# Patient Record
Sex: Female | Born: 1975 | Race: Black or African American | Hispanic: No | Marital: Single | Smoking: Never smoker
Health system: Southern US, Community
[De-identification: ages and names within clinical notes are randomized; demographics above are authoritative.]

## PROBLEM LIST (undated history)

## (undated) DIAGNOSIS — R87629 Unspecified abnormal cytological findings in specimens from vagina: Secondary | ICD-10-CM

## (undated) DIAGNOSIS — I10 Essential (primary) hypertension: Secondary | ICD-10-CM

## (undated) DIAGNOSIS — B009 Herpesviral infection, unspecified: Secondary | ICD-10-CM

## (undated) DIAGNOSIS — Q336 Congenital hypoplasia and dysplasia of lung: Secondary | ICD-10-CM

## (undated) DIAGNOSIS — O139 Gestational [pregnancy-induced] hypertension without significant proteinuria, unspecified trimester: Secondary | ICD-10-CM

## (undated) DIAGNOSIS — R87619 Unspecified abnormal cytological findings in specimens from cervix uteri: Secondary | ICD-10-CM

## (undated) DIAGNOSIS — IMO0002 Reserved for concepts with insufficient information to code with codable children: Secondary | ICD-10-CM

## (undated) HISTORY — DX: Unspecified abnormal cytological findings in specimens from vagina: R87.629

## (undated) HISTORY — DX: Reserved for concepts with insufficient information to code with codable children: IMO0002

## (undated) HISTORY — DX: Congenital hypoplasia and dysplasia of lung: Q33.6

## (undated) HISTORY — PX: CRYOTHERAPY: SHX1416

## (undated) HISTORY — DX: Unspecified abnormal cytological findings in specimens from cervix uteri: R87.619

---
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
## 1997-12-05 ENCOUNTER — Emergency Department (HOSPITAL_COMMUNITY): Admission: EM | Admit: 1997-12-05 | Discharge: 1997-12-05 | Payer: Self-pay | Admitting: Emergency Medicine

## 1999-07-08 ENCOUNTER — Encounter: Payer: Self-pay | Admitting: Family Medicine

## 1999-07-08 ENCOUNTER — Ambulatory Visit (HOSPITAL_COMMUNITY): Admission: RE | Admit: 1999-07-08 | Discharge: 1999-07-08 | Payer: Self-pay | Admitting: Family Medicine

## 2001-04-10 ENCOUNTER — Emergency Department (HOSPITAL_COMMUNITY): Admission: EM | Admit: 2001-04-10 | Discharge: 2001-04-11 | Payer: Self-pay | Admitting: Emergency Medicine

## 2002-03-01 ENCOUNTER — Other Ambulatory Visit: Admission: RE | Admit: 2002-03-01 | Discharge: 2002-03-01 | Payer: Self-pay | Admitting: Family Medicine

## 2011-10-21 DIAGNOSIS — R87613 High grade squamous intraepithelial lesion on cytologic smear of cervix (HGSIL): Secondary | ICD-10-CM | POA: Insufficient documentation

## 2011-11-26 ENCOUNTER — Telehealth: Payer: Self-pay | Admitting: Obstetrics and Gynecology

## 2011-11-26 NOTE — Telephone Encounter (Signed)
Tc to pt per telephone call. Pt has an appt 11/30/11 for abn pap smear. Pt states,"told will likely have a colposcopy due to abn pap". Informed pt if colpo is done, she should be able to return to work the same day. Informed pt to take otc Tylenol or Ibuprofen prior to appt in case colposcopy is performed. Pt voices understanding.

## 2011-11-26 NOTE — Telephone Encounter (Signed)
Lm on vm to cb per telephone.

## 2011-11-30 ENCOUNTER — Ambulatory Visit (INDEPENDENT_AMBULATORY_CARE_PROVIDER_SITE_OTHER): Payer: Private Health Insurance - Indemnity | Admitting: Obstetrics and Gynecology

## 2011-11-30 ENCOUNTER — Encounter: Payer: Self-pay | Admitting: Obstetrics and Gynecology

## 2011-11-30 VITALS — BP 122/70 | Ht 63.0 in | Wt 198.0 lb

## 2011-11-30 DIAGNOSIS — N898 Other specified noninflammatory disorders of vagina: Secondary | ICD-10-CM

## 2011-11-30 DIAGNOSIS — IMO0002 Reserved for concepts with insufficient information to code with codable children: Secondary | ICD-10-CM

## 2011-11-30 DIAGNOSIS — Z139 Encounter for screening, unspecified: Secondary | ICD-10-CM

## 2011-11-30 DIAGNOSIS — R6889 Other general symptoms and signs: Secondary | ICD-10-CM

## 2011-11-30 LAB — POCT WET PREP (WET MOUNT)
Clue Cells Wet Prep Whiff POC: NEGATIVE
pH: 5

## 2011-11-30 LAB — POCT URINE PREGNANCY: Preg Test, Ur: NEGATIVE

## 2011-11-30 NOTE — Progress Notes (Signed)
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

## 2011-11-30 NOTE — Addendum Note (Signed)
Addended by: Marla Roe A on: 11/30/2011 11:52 AM   Modules accepted: Orders

## 2011-12-02 ENCOUNTER — Telehealth: Payer: Self-pay | Admitting: Obstetrics and Gynecology

## 2011-12-02 LAB — PATHOLOGY

## 2011-12-02 NOTE — Telephone Encounter (Signed)
Tc to pt regarding msg, lm on vm to call back. 

## 2011-12-10 NOTE — Telephone Encounter (Signed)
Tc to pt regarding msg.  Lm on vm if question answered to disregard msg, if still has questions to call back.

## 2011-12-17 ENCOUNTER — Encounter: Payer: Self-pay | Admitting: Obstetrics and Gynecology

## 2011-12-17 ENCOUNTER — Ambulatory Visit (INDEPENDENT_AMBULATORY_CARE_PROVIDER_SITE_OTHER): Payer: Private Health Insurance - Indemnity | Admitting: Obstetrics and Gynecology

## 2011-12-17 VITALS — BP 122/74 | Resp 16 | Ht 63.0 in | Wt 199.0 lb

## 2011-12-17 DIAGNOSIS — IMO0002 Reserved for concepts with insufficient information to code with codable children: Secondary | ICD-10-CM

## 2011-12-17 DIAGNOSIS — R6889 Other general symptoms and signs: Secondary | ICD-10-CM

## 2011-12-17 NOTE — Progress Notes (Signed)
Here to f/u Bx and ECC results  A. Cervix- Biopsy, 2 o'clock:  High grade squamous intraepithelial lesion (HSIL), moderate dysplasia and HPV infection, CIN II.  See comment.  B. Cervix- Biopsy, 6 o'clock:  Low grade squamous intraepithelial lesion (LSIL), mild dysplasia and HPV infection, CIN I.  See comment.  C. Endocervix - Curettage:  Fragments of benign endocervical mucosa.  Negative for atypia.  Filed Vitals:   12/17/11 1513  BP: 122/74  Resp: 16   A/P Options and recs discussed Several questions answered Pt wants to do cryo - will schedule next available

## 2012-01-12 ENCOUNTER — Telehealth: Payer: Self-pay | Admitting: Obstetrics and Gynecology

## 2012-01-12 ENCOUNTER — Telehealth: Payer: Self-pay

## 2012-01-12 NOTE — Telephone Encounter (Signed)
JACKIE, THIS PT IS READY TO DO THE CRYO PROCEDURE WITH AR. I TOLD THE PT AS SOON AS WE CAN GET THE PROCEDURE TIME, WE WILL GIVE PT A CALL BACK. PLEASE MAKE SURE THIS PT IS TAKING CARE OF. THANKS

## 2012-01-12 NOTE — Telephone Encounter (Signed)
PT WANT TO PROCEED WITH THE CRYO SURGERY AND WANT TO MAKE SURE SOMEONE IS SCHEDULING PT. INFORMED PT THAT  ONCE  THEY GET A DATE WHEN AR  CAN DO THE PROCEDURE, SOMEONE WILL GIVE YOU A CALL BACK. PT VOICED UNDERSTANDING.

## 2012-01-12 NOTE — Telephone Encounter (Signed)
Lm for to call back

## 2012-01-20 ENCOUNTER — Telehealth: Payer: Self-pay

## 2012-01-20 NOTE — Telephone Encounter (Signed)
LM for pt to cb re scheduling cryo. Melody Comas A

## 2012-01-20 NOTE — Telephone Encounter (Signed)
Spoke to pt and sched appt. For 02/22/2012. Brenda Arellano A

## 2012-01-20 NOTE — Telephone Encounter (Signed)
LM for pt to cb to get scheduled for procedure. Melody Comas A

## 2012-02-22 ENCOUNTER — Encounter: Payer: Self-pay | Admitting: Obstetrics and Gynecology

## 2012-02-22 ENCOUNTER — Ambulatory Visit: Payer: Private Health Insurance - Indemnity | Admitting: Obstetrics and Gynecology

## 2012-02-22 VITALS — BP 112/62 | Ht 63.0 in | Wt 203.0 lb

## 2012-02-22 DIAGNOSIS — Z139 Encounter for screening, unspecified: Secondary | ICD-10-CM

## 2012-02-22 LAB — POCT URINE PREGNANCY: Preg Test, Ur: NEGATIVE

## 2012-02-22 NOTE — Progress Notes (Signed)
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

## 2012-02-24 ENCOUNTER — Emergency Department (HOSPITAL_BASED_OUTPATIENT_CLINIC_OR_DEPARTMENT_OTHER)
Admission: EM | Admit: 2012-02-24 | Discharge: 2012-02-24 | Disposition: A | Discharge status: Home / Self Care | Payer: Managed Care, Other (non HMO) | Attending: Emergency Medicine | Admitting: Emergency Medicine

## 2012-02-24 ENCOUNTER — Emergency Department (HOSPITAL_BASED_OUTPATIENT_CLINIC_OR_DEPARTMENT_OTHER): Payer: Managed Care, Other (non HMO)

## 2012-02-24 ENCOUNTER — Encounter (HOSPITAL_BASED_OUTPATIENT_CLINIC_OR_DEPARTMENT_OTHER): Payer: Self-pay | Admitting: *Deleted

## 2012-02-24 DIAGNOSIS — R079 Chest pain, unspecified: Secondary | ICD-10-CM

## 2012-02-24 DIAGNOSIS — R05 Cough: Secondary | ICD-10-CM | POA: Insufficient documentation

## 2012-02-24 DIAGNOSIS — Z79899 Other long term (current) drug therapy: Secondary | ICD-10-CM | POA: Insufficient documentation

## 2012-02-24 DIAGNOSIS — R0789 Other chest pain: Secondary | ICD-10-CM | POA: Insufficient documentation

## 2012-02-24 DIAGNOSIS — R059 Cough, unspecified: Secondary | ICD-10-CM | POA: Insufficient documentation

## 2012-02-24 DIAGNOSIS — Z9889 Other specified postprocedural states: Secondary | ICD-10-CM | POA: Insufficient documentation

## 2012-02-24 LAB — CBC WITH DIFFERENTIAL/PLATELET
Basophils Absolute: 0 10*3/uL (ref 0.0–0.1)
Basophils Relative: 1 % (ref 0–1)
Eosinophils Absolute: 0.1 10*3/uL (ref 0.0–0.7)
Eosinophils Relative: 2 % (ref 0–5)
HCT: 35.8 % — ABNORMAL LOW (ref 36.0–46.0)
MCHC: 34.1 g/dL (ref 30.0–36.0)
MCV: 86.7 fL (ref 78.0–100.0)
Monocytes Absolute: 0.8 10*3/uL (ref 0.1–1.0)
Platelets: 209 10*3/uL (ref 150–400)
RDW: 12.9 % (ref 11.5–15.5)

## 2012-02-24 LAB — D-DIMER, QUANTITATIVE: D-Dimer, Quant: <0.27 ug/mL-FEU (ref 0.00–0.48)

## 2012-02-24 LAB — BASIC METABOLIC PANEL
Calcium: 9.7 mg/dL (ref 8.4–10.5)
Creatinine, Ser: 0.9 mg/dL (ref 0.50–1.10)
GFR calc Af Amer: >90 mL/min (ref >90)
GFR calc non Af Amer: 81 mL/min — ABNORMAL LOW (ref >90)

## 2012-02-24 NOTE — ED Notes (Signed)
Coughing throughout the day. States this evening she had a sudden onset of sharp pain in the center of her chest.

## 2012-02-24 NOTE — ED Notes (Signed)
Pt.  Reports she had lung surgery on the R lung for the lung being up in the shoulder blade area.  Pt. In no distress and has no cough upon assessment.  Pt. Has clear lung snds bilat.

## 2012-02-24 NOTE — ED Provider Notes (Signed)
History     CSN: [id number]  Arrival date & time 02/24/12  1748   First MD Initiated Contact with Patient 02/24/12 1817      Chief Complaint  Patient presents with  . Chest Pain    (Consider location/radiation/quality/duration/timing/severity/associated sxs/prior treatment) HPI Pt reports several episodes of moderate sharp mostly right sided chest pain throughout the day today. She has had some coughing, but no fever, no sputum, no SOB. Pain comes suddenly and lasts about 3 min. No particular provoking or relieving factors. Denies prior history of same. She is low risk [place], but PERC positive for birth control.   History reviewed. No pertinent past medical history.  Past Surgical History  Procedure Date  . Right lung 1987    Family History  Problem Relation Age of Onset  . Kidney disease Father     History  Substance Use Topics  . Smoking status: Never Smoker   . Smokeless tobacco: Not on file  . Alcohol Use: Yes    OB History    Grav Para Term Preterm Abortions TAB SAB Ect Mult Living   0               Review of Systems All other systems reviewed and are negative except as noted in HPI.   Allergies  Review of patient's allergies indicates no known allergies.  Home Medications   Current Outpatient Rx  Name  Route  Sig  Dispense  Refill  . DROSPIRENONE-ETHINYL ESTRADIOL 3-0.02 MG PO TABS   Oral   Take 1 tablet by mouth daily.           BP 136/81  Pulse 92  Temp 98.4 F (36.9 C) (Oral)  Resp 22  SpO2 100%  LMP 02/13/2012  Physical Exam  Nursing note and vitals reviewed. Constitutional: She is oriented to person, place, and time. She appears well-developed and well-nourished.  HENT:  Head: Normocephalic and atraumatic.  Eyes: EOM are normal. Pupils are equal, round, and reactive to light.  Neck: Normal range of motion. Neck supple.  Cardiovascular: Normal rate, normal heart sounds and intact distal pulses.   Pulmonary/Chest: Effort normal and  breath sounds normal. She exhibits no tenderness.  Abdominal: Bowel sounds are normal. She exhibits no distension. There is no tenderness.  Musculoskeletal: Normal range of motion. She exhibits no edema and no tenderness.  Neurological: She is alert and oriented to person, place, and time. She has normal strength. No cranial nerve deficit or sensory deficit.  Skin: Skin is warm and dry. No rash noted.  Psychiatric: She has a normal mood and affect.    ED Course  Procedures (including critical care time)  Labs Reviewed  CBC WITH DIFFERENTIAL - Abnormal; Notable for the following:    HCT 35.8 (*)     Monocytes Relative 13 (*)     All other components within normal limits  BASIC METABOLIC PANEL - Abnormal; Notable for the following:    Glucose, Bld 125 (*)     GFR calc non Af Amer 81 (*)     All other components within normal limits  D-DIMER, QUANTITATIVE   Dg Chest 2 View  02/24/2012  *RADIOLOGY REPORT*  Clinical Data:   chest pain. ; congenital hypoplastic right lung  CHEST - 2 VIEW  Comparison: 07/08/1999 by report only  Findings: Marked elevation of the right diaphragmatic leaflet, as was described on previous exam.  Lungs clear.  No definite effusion.  Heart size normal.  Regional bones unremarkable.  IMPRESSION:  1.  Elevated right hemidiaphragm, probably chronic.  No definite acute disease.   Original Report Authenticated By: D. Andria Rhein, MD      No diagnosis found.    MDM   Date: 02/24/2012  Rate: 89  Rhythm: normal sinus rhythm  QRS Axis: normal  Intervals: normal  ST/T Wave abnormalities: normal  Conduction Disutrbances: none  Narrative Interpretation: unremarkable  8:10 PM Labs unremarkable. Symptoms atypical for ACS/CAD. Low risk for acute life threatening cardiac or pulmonary process.           Sharanya Templin B. Bernette Mayers, MD 02/24/12 2011

## 2012-04-07 ENCOUNTER — Encounter: Payer: Self-pay | Admitting: Obstetrics and Gynecology

## 2012-04-07 ENCOUNTER — Ambulatory Visit: Payer: Private Health Insurance - Indemnity | Admitting: Obstetrics and Gynecology

## 2012-04-07 VITALS — BP 112/72 | HR 72 | Wt 207.0 lb

## 2012-04-07 DIAGNOSIS — N898 Other specified noninflammatory disorders of vagina: Secondary | ICD-10-CM

## 2012-04-07 DIAGNOSIS — N760 Acute vaginitis: Secondary | ICD-10-CM

## 2012-04-07 LAB — POCT WET PREP (WET MOUNT)

## 2012-04-07 MED ORDER — TINIDAZOLE 500 MG PO TABS: 2.0000 g | ORAL_TABLET | Freq: Every day | ORAL | Status: DC

## 2012-04-07 NOTE — Addendum Note (Signed)
Addended by: Robin Searing on: 04/07/2012 04:32 PM   Modules accepted: Orders

## 2012-04-07 NOTE — Progress Notes (Addendum)
Here to f/u cryo  Filed Vitals:   04/07/12 1454  BP: 112/72  Pulse: 72   ROS: noncontributory  Pelvic exam:  VULVA: normal appearing vulva with no masses, tenderness or lesions,  VAGINA: normal appearing vagina with normal color and discharge, no lesions, CERVIX: normal appearing cervix without discharge or lesions, well healed, white d/c UTERUS: uterus is normal size, shape, consistency and nontender,  ADNEXA: normal adnexa in size, nontender and no masses.  A/P rec repeat pap q4-45mths x 33yr Wet prep - borderline BV - tindamax

## 2012-04-07 NOTE — Addendum Note (Signed)
Addended by: Osborn Coho on: 04/07/2012 04:33 PM   Modules accepted: Orders

## 2015-06-05 ENCOUNTER — Emergency Department (HOSPITAL_BASED_OUTPATIENT_CLINIC_OR_DEPARTMENT_OTHER): Payer: Managed Care, Other (non HMO)

## 2015-06-05 ENCOUNTER — Encounter (HOSPITAL_BASED_OUTPATIENT_CLINIC_OR_DEPARTMENT_OTHER): Payer: Self-pay | Admitting: *Deleted

## 2015-06-05 ENCOUNTER — Emergency Department (HOSPITAL_BASED_OUTPATIENT_CLINIC_OR_DEPARTMENT_OTHER)
Admission: EM | Admit: 2015-06-05 | Discharge: 2015-06-05 | Disposition: A | Discharge status: Home / Self Care | Payer: Managed Care, Other (non HMO) | Attending: Emergency Medicine | Admitting: Emergency Medicine

## 2015-06-05 DIAGNOSIS — R079 Chest pain, unspecified: Secondary | ICD-10-CM | POA: Insufficient documentation

## 2015-06-05 LAB — COMPREHENSIVE METABOLIC PANEL
ALT: 15 U/L (ref 14–54)
AST: 18 U/L (ref 15–41)
Albumin: 3.9 g/dL (ref 3.5–5.0)
Alkaline Phosphatase: 58 U/L (ref 38–126)
Anion gap: 2 — ABNORMAL LOW (ref 5–15)
BILIRUBIN TOTAL: 1.1 mg/dL (ref 0.3–1.2)
BUN: 8 mg/dL (ref 6–20)
CHLORIDE: 107 mmol/L (ref 101–111)
CO2: 27 mmol/L (ref 22–32)
CREATININE: 0.9 mg/dL (ref 0.44–1.00)
Calcium: 9 mg/dL (ref 8.9–10.3)
Glucose, Bld: 94 mg/dL (ref 65–99)
POTASSIUM: 4 mmol/L (ref 3.5–5.1)
Sodium: 136 mmol/L (ref 135–145)
TOTAL PROTEIN: 7.7 g/dL (ref 6.5–8.1)

## 2015-06-05 LAB — CBC WITH DIFFERENTIAL/PLATELET
BASOS PCT: 1 %
Basophils Absolute: 0 10*3/uL (ref 0.0–0.1)
Eosinophils Absolute: 0.1 10*3/uL (ref 0.0–0.7)
Eosinophils Relative: 2 %
HEMATOCRIT: 38.7 % (ref 36.0–46.0)
HEMOGLOBIN: 12.9 g/dL (ref 12.0–15.0)
LYMPHS ABS: 1.4 10*3/uL (ref 0.7–4.0)
Lymphocytes Relative: 23 %
MCH: 29.7 pg (ref 26.0–34.0)
MCHC: 33.3 g/dL (ref 30.0–36.0)
MCV: 89 fL (ref 78.0–100.0)
MONOS PCT: 14 %
Monocytes Absolute: 0.9 10*3/uL (ref 0.1–1.0)
NEUTROS PCT: 61 %
Neutro Abs: 3.7 10*3/uL (ref 1.7–7.7)
Platelets: 206 10*3/uL (ref 150–400)
RBC: 4.35 MIL/uL (ref 3.87–5.11)
RDW: 13.3 % (ref 11.5–15.5)
WBC: 6.1 10*3/uL (ref 4.0–10.5)

## 2015-06-05 LAB — TROPONIN I: Troponin I: <0.03 ng/mL (ref <0.031)

## 2015-06-05 LAB — LIPASE, BLOOD: LIPASE: 18 U/L (ref 11–51)

## 2015-06-05 NOTE — ED Notes (Signed)
Pt c/o left sided  Chest pain  X 1 hr with left arm "tingling" denies n/v or SOB

## 2015-06-05 NOTE — ED Notes (Signed)
Presents today with chest pain, onset prior to lunch. Chest resolved after lunch, upon returning to work, chest pains returns. Pain is at left ant chest, also began having pain under left breast, burning sensation, lasting approx 30 min.

## 2015-06-05 NOTE — ED Provider Notes (Signed)
CSN: [id number]     Arrival date & time 06/05/15  1439 History   First MD Initiated Contact with Patient 06/05/15 1512     Chief Complaint  Patient presents with  . Chest Pain     Patient is a 40 y.o. female presenting with chest pain. The history is provided by the patient. No language interpreter was used.  Chest Pain  MARCENE LASKOWSKI is a 40 y.o. female who presents to the Emergency Department complaining of chest pain. She reports intermittent left upper chest pain and left axillary chest pain that started about 1:00 today. The pain is waxing and waning and a burning type sensation. Episodes last about 5 minutes. There are no clear alleviating or worsening factors. She had a little bit of tingling in heat in her left hand. This is also resolving. No nausea, vomiting, abdominal pain, diaphoresis, leg swelling or pain. She has no medical problems. 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 No family history of early cardiac disease.  Past Medical History  Diagnosis Date  . Abnormal Pap smear    Past Surgical History  Procedure Laterality Date  . Right lung  1987   Family History  Problem Relation Age of Onset  . Kidney disease Father    Social History  Substance Use Topics  . Smoking status: Never Smoker   . Smokeless tobacco: Never Used  . Alcohol Use: Yes   OB History    Gravida Para Term Preterm AB TAB SAB Ectopic Multiple Living   0              Review of Systems  Cardiovascular: Positive for chest pain.      Allergies  Review of patient's allergies indicates no known allergies.  Home Medications   Prior to Admission medications   Not on File   BP 117/70 mmHg  Pulse 68  Temp(Src) 99.1 F (37.3 C) (Oral)  Resp 16  Ht  (1.6 m)  Wt 240 lb (108.863 kg)  BMI 42.52 kg/m2  SpO2 99%  LMP 05/14/2015 Physical Exam  Constitutional: She is oriented to person, place, and time. She appears well-developed and  well-nourished.  HENT:  Head: Normocephalic and atraumatic.  Cardiovascular: Normal rate and regular rhythm.   No murmur heard. Pulmonary/Chest: Effort normal. No respiratory distress. She exhibits no tenderness.  Decreased air movement in right lung fields.   Abdominal: Soft. There is no rebound and no guarding.  Mild LUQ tenderness  Musculoskeletal: She exhibits no edema or tenderness.  Neurological: She is alert and oriented to person, place, and time.  Skin: Skin is warm and dry.  Psychiatric: She has a normal mood and affect. Her behavior is normal.  Nursing note and vitals reviewed.   ED Course  Procedures (including critical care time) Labs Review Labs Reviewed  COMPREHENSIVE METABOLIC PANEL - Abnormal; Notable for the following:    Anion gap 2 (*)    All other components within normal limits  CBC WITH DIFFERENTIAL/PLATELET  TROPONIN I  LIPASE, BLOOD  TROPONIN I    Imaging Review Dg Chest 2 View  06/05/2015  CLINICAL DATA:  Chest pain EXAM: CHEST  2 VIEW COMPARISON:  02/24/2012 FINDINGS: Chronic elevation of the right hemidiaphragm is noted. Normal heart size. No pleural effusion or edema. No airspace consolidation is identified. The visualized osseous structures are unremarkable. IMPRESSION: 1. 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 2. No acute findings.  Electronically Signed   By: Signa Kellaylor  Stroud M.D.   On: 06/05/2015 17:38   I have personally reviewed and evaluated these images and lab results as part of my medical decision-making.   EKG Interpretation   Date/Time:  Thursday June 05 2015 14:45:28 EDT Ventricular Rate:  100 PR Interval:  144 QRS Duration: 96 QT Interval:  342 QTC Calculation: 441 R Axis:   15 Text Interpretation:  Normal sinus rhythm Possible Left atrial enlargement  Incomplete right bundle branch block Borderline ECG Incomplete right  bundle branch block is new Confirmed by [facility] RecoveryCHLOSSMAN MD, ERIN ([id number]) on  06/05/2015 2:58:45  PM      MDM   Final diagnoses:  Chest pain, unspecified chest pain type    Patient here for evaluation of chest pain, now resolved in the department. Presentation is not consistent with PE, ACS, dissection, pneumonia. Discussed home care, outpatient follow-up, return precautions.    Tilden FossaElizabeth Lawana Hartzell, MD 06/05/15 2340

## 2015-06-05 NOTE — Discharge Instructions (Signed)
Nonspecific Chest Pain  °Chest pain can be caused by many different conditions. There is always a chance that your pain could be related to something serious, such as a heart attack or a blood clot in your lungs. Chest pain can also be caused by conditions that are not life-threatening. If you have chest pain, it is very important to follow up with your health care provider. °CAUSES  °Chest pain can be caused by: °· Heartburn. °· Pneumonia or bronchitis. °· Anxiety or stress. °· Inflammation around your heart (pericarditis) or lung (pleuritis or pleurisy). °· A blood clot in your lung. °· A collapsed lung (pneumothorax). It can develop suddenly on its own (spontaneous pneumothorax) or from trauma to the chest. °· Shingles infection (varicella-zoster virus). °· Heart attack. °· Damage to the bones, muscles, and cartilage that make up your chest wall. This can include: °¨ Bruised bones due to injury. °¨ Strained muscles or cartilage due to frequent or repeated coughing or overwork. °¨ Fracture to one or more ribs. °¨ Sore cartilage due to inflammation (costochondritis). °RISK FACTORS  °Risk factors for chest pain may include: °· Activities that increase your risk for trauma or injury to your chest. °· Respiratory infections or conditions that cause frequent coughing. °· Medical conditions or overeating that can cause heartburn. °· Heart disease or family history of heart disease. °· Conditions or health behaviors that increase your risk of developing a blood clot. °· Having had chicken pox (varicella zoster). °SIGNS AND SYMPTOMS °Chest pain can feel like: °· Burning or tingling on the surface of your chest or deep in your chest. °· Crushing, pressure, aching, or squeezing pain. °· Dull or sharp pain that is worse when you move, cough, or take a deep breath. °· Pain that is also felt in your back, neck, shoulder, or arm, or pain that spreads to any of these areas. °Your chest pain may come and go, or it may stay  constant. °DIAGNOSIS °Lab tests or other studies may be needed to find the cause of your pain. Your health care provider may have you take a test called an ambulatory ECG (electrocardiogram). An ECG records your heartbeat patterns at the time the test is performed. You may also have other tests, such as: °· Transthoracic echocardiogram (TTE). During echocardiography, sound waves are used to create a picture of all of the heart structures and to look at how blood flows through your heart. °· Transesophageal echocardiogram (TEE). This is a more advanced imaging test that obtains images from inside your body. It allows your health care provider to see your heart in finer detail. °· Cardiac monitoring. This allows your health care provider to monitor your heart rate and rhythm in real time. °· Holter monitor. This is a portable device that records your heartbeat and can help to diagnose abnormal heartbeats. It allows your health care provider to track your heart activity for several days, if needed. °· Stress tests. These can be done through exercise or by taking medicine that makes your heart beat more quickly. °· Blood tests. °· Imaging tests. °TREATMENT  °Your treatment depends on what is causing your chest pain. Treatment may include: °· Medicines. These may include: °¨ Acid blockers for heartburn. °¨ Anti-inflammatory medicine. °¨ Pain medicine for inflammatory conditions. °¨ Antibiotic medicine, if an infection is present. °¨ Medicines to dissolve blood clots. °¨ Medicines to treat coronary artery disease. °· Supportive care for conditions that do not require medicines. This may include: °¨ Resting. °¨ Applying heat   or cold packs to injured areas. °¨ Limiting activities until pain decreases. °HOME CARE INSTRUCTIONS °· If you were prescribed an antibiotic medicine, finish it all even if you start to feel better. °· Avoid any activities that bring on chest pain. °· Do not use any tobacco products, including  cigarettes, chewing tobacco, or electronic cigarettes. If you need help quitting, ask your health care provider. °· Do not drink alcohol. °· Take medicines only as directed by your health care provider. °· Keep all follow-up visits as directed by your health care provider. This is important. This includes any further testing if your chest pain does not go away. °· If heartburn is the cause for your chest pain, you may be told to keep your head raised (elevated) while sleeping. This reduces the chance that acid will go from your stomach into your esophagus. °· Make lifestyle changes as directed by your health care provider. These may include: °¨ Getting regular exercise. Ask your health care provider to suggest some activities that are safe for you. °¨ Eating a heart-healthy diet. A registered dietitian can help you to learn healthy eating options. °¨ Maintaining a healthy weight. °¨ Managing diabetes, if necessary. °¨ Reducing stress. °SEEK MEDICAL CARE IF: °· Your chest pain does not go away after treatment. °· You have a rash with blisters on your chest. °· You have a fever. °SEEK IMMEDIATE MEDICAL CARE IF:  °· Your chest pain is worse. °· You have an increasing cough, or you cough up blood. °· You have severe abdominal pain. °· You have severe weakness. °· You faint. °· You have chills. °· You have sudden, unexplained chest discomfort. °· You have sudden, unexplained discomfort in your arms, back, neck, or jaw. °· You have shortness of breath at any time. °· You suddenly start to sweat, or your skin gets clammy. °· You feel nauseous or you vomit. °· You suddenly feel light-headed or dizzy. °· Your heart begins to beat quickly, or it feels like it is skipping beats. °These symptoms may represent a serious problem that is an emergency. Do not wait to see if the symptoms will go away. Get medical help right away. Call your local emergency services (911 in the [place] Do not drive yourself to the hospital. °  °This  information is not intended to replace advice given to you by your health care provider. Make sure you discuss any questions you have with your health care provider. °  °Document Released: 11/04/2004 Document Revised: 02/15/2014 Document Reviewed: 08/31/2013 °[employer] Interactive Patient Education ©2016 [employer] ° °

## 2015-12-17 ENCOUNTER — Ambulatory Visit (HOSPITAL_COMMUNITY)
Admission: EM | Admit: 2015-12-17 | Discharge: 2015-12-17 | Disposition: A | Discharge status: Home / Self Care | Payer: Managed Care, Other (non HMO) | Attending: Family Medicine | Admitting: Family Medicine

## 2015-12-17 ENCOUNTER — Encounter (HOSPITAL_COMMUNITY): Payer: Self-pay | Admitting: *Deleted

## 2015-12-17 DIAGNOSIS — J029 Acute pharyngitis, unspecified: Secondary | ICD-10-CM | POA: Diagnosis not present

## 2015-12-17 DIAGNOSIS — B9789 Other viral agents as the cause of diseases classified elsewhere: Secondary | ICD-10-CM

## 2015-12-17 DIAGNOSIS — J069 Acute upper respiratory infection, unspecified: Secondary | ICD-10-CM

## 2015-12-17 LAB — POCT RAPID STREP A: STREPTOCOCCUS, GROUP A SCREEN (DIRECT): NEGATIVE

## 2015-12-17 MED ORDER — IBUPROFEN 100 MG/5ML PO SUSP
600.0000 mg | Freq: Once | ORAL | Status: AC
  Administered 2015-12-17: 600 mg via ORAL

## 2015-12-17 MED ORDER — ONDANSETRON HCL 4 MG PO TABS: 4.0000 mg | ORAL_TABLET | Freq: Four times a day (QID) | ORAL | 0 refills | Status: DC

## 2015-12-17 MED ORDER — IBUPROFEN 100 MG/5ML PO SUSP
ORAL | Status: AC
  Filled 2015-12-17: qty 30

## 2015-12-17 NOTE — ED Provider Notes (Signed)
[facility]  CSN: [id number] Arrival date & time: 12/17/15  [id number]  History   Chief Complaint Chief Complaint  Patient presents with  . Cough   HPI Brenda Arellano is a 40 y.o. female nonsmoker presenting for sore throat and cough.   She reports gradual onset of illness over 2 days starting with sore throat, chills, and developing fatigue, malaise and subjective fevers. Congestion and a cough developed productive of clear sputum yesterday and multiple episodes of emesis and constant nausea today. She's drunk a bit of water here, otherwise nothing today. No blood in emesis, no diarrhea, abd pain, dysuria. No chest pain, dyspnea, wheezing, palpitations. Has not tried medications yet.   Past Medical History:  Diagnosis Date  . Abnormal Pap smear     Patient Active Problem List   Diagnosis Date Noted  . 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 Right hypoplastic lung  1987    OB History    Gravida Para Term Preterm AB Living   0             SAB TAB Ectopic Multiple Live Births                 Home Medications    Prior to Admission medications   Medication Sig Start Date End Date Taking? Authorizing Provider  ondansetron (ZOFRAN) 4 MG tablet Take 1 tablet (4 mg total) by mouth every 6 (six) hours. 12/17/15   Tyrone Nineyan B Grunz, MD    Family History Family History  Problem Relation Age of Onset  . Kidney disease Father     Social History Social History  Substance Use Topics  . Smoking status: Never Smoker  . Smokeless tobacco: Never Used  . Alcohol use Yes     Comment: occasionally     Allergies   Patient has no known allergies.   Review of Systems Review of Systems As above  Physical Exam Triage Vital Signs ED Triage Vitals  Enc Vitals Group     BP 12/17/15 1929 122/80     Pulse Rate 12/17/15 1929 115     Resp 12/17/15 1929 22   Temp 12/17/15 1929 99.2 F (37.3 C)     Temp Source 12/17/15 1929 Oral     SpO2 12/17/15 1929 95 %     Weight --      Height --      Head Circumference --      Peak Flow --      Pain Score 12/17/15 1933 8     Pain Loc --      Pain Edu? --      Excl. in GC? --    No data found.   Updated Vital Signs BP 122/80 (BP Location: Right Arm)   Pulse 115   Temp 100.8 F (38.2 C) (Temporal)   Resp 22   LMP 12/09/2015 (Exact Date)   SpO2 95%   Physical Exam  Constitutional: She is oriented to person, place, and time. She appears well-developed and well-nourished. No distress.  HENT:  Right Ear: External ear normal.  Left Ear: External ear normal.  Nose: Nose normal.  erythematous oropharynx with enlarged injected tonsils without exudates.   Eyes: Conjunctivae and EOM are normal. Pupils are equal, round, and reactive to light. No scleral icterus.  Neck: Normal range of motion. Neck supple. No JVD present.  Cardiovascular: Normal heart sounds and intact distal pulses.   No murmur heard. Tachycardic but regular  Pulmonary/Chest: Effort normal and breath sounds normal. No respiratory distress. She has no wheezes. She has no rales.  Abdominal: Soft. Bowel sounds are normal. She exhibits no distension. There is no tenderness.  Musculoskeletal: Normal range of motion. She exhibits no edema or tenderness.  Lymphadenopathy:    She has cervical adenopathy (tender anterior).  Neurological: She is alert and oriented to person, place, and time. She exhibits normal muscle tone.  Skin: Skin is warm and dry. Capillary refill takes less than 2 seconds.  Vitals reviewed.  UC Treatments / Results  Labs (all labs ordered are listed, but only abnormal results are displayed) Labs Reviewed  POCT RAPID STREP A    EKG  EKG Interpretation None      Radiology No results found.  Procedures Procedures (including critical care time)  Medications Ordered in UC Medications  ibuprofen  (ADVIL,MOTRIN) 100 MG/5ML suspension 600 mg (600 mg Oral Given 12/17/15 1935)    Initial Impression / Assessment and Plan / UC Course  I have reviewed the triage vital signs and the nursing notes.  Pertinent labs & imaging results that were available during my care of the patient were reviewed by me and considered in my medical decision making (see chart for details).  Final Clinical Impressions(s) / UC Diagnoses   Final diagnoses:  Viral URI with cough   40 y.o. female presenting with 48 hours gradually worsening URI symptoms without abrupt onset to suggest influenza and without pulmonary exam findings consistent with pneumonia. She has URI symptoms that started with sore throat and has severely injected tonsils and anterior cervical lymphadenopathy on exam, so rapid strep test is performed and negative, culture to follow, without empiric treatment. Zofran provided as part of supportive therapy including hydration, lozenges, honey, nasal saline. Return precautions reviewed.   New Prescriptions New Prescriptions   ONDANSETRON (ZOFRAN) 4 MG TABLET    Take 1 tablet (4 mg total) by mouth every 6 (six) hours.     Tyrone Nineyan B Grunz, MD 12/17/15 2012

## 2015-12-17 NOTE — Discharge Instructions (Signed)
You have a nasty virus.  The good news is it goes away on its own and the bad news is we don't have much of a treatment to make it go away faster. Antibiotics are useless against viruses. If your strep swab is positive, we will contact you as soon as it results and send in antibiotics for it.  What to do:  - Drink adequate fluids. Take zofran if needed to tolerate them. - Use nasal saline spray as needed to help clear runny nose. - Take warm showers and drink hot tea. The humid air will help with runny nose and congestion. - Take 1 tbsp of honey every 2 hours to help with cough and sore throat. - You can use tylenol alternating with motrin every 3 hours for fever with pain.  - All members in the household should wash their hands frequently. Cold-causing viruses can stay on hands for 2 days or more.  - Minimize your exposure to smoke from cigarettes, etc.  Timeline:  - Colds usually persist for 3 to 10 days in the most people but it can take 2-3 weeks for cough to completely go away.  - If you start getting worse after initial improvement or if this is not going away in the next 10 days you should make another appointment. If you have difficulty breathing you should present to the emergency room right away.

## 2015-12-17 NOTE — ED Triage Notes (Signed)
C/O HA, productive cough, extreme fatigue, nausea since yesterday.  Had 2 episodes vomiting yesterday; none today.  Able to keep down PO fluids, but poor appetite.  C/O feeling feverish.

## 2015-12-20 LAB — CULTURE, GROUP A STREP (THRC)

## 2016-02-26 DIAGNOSIS — Z3490 Encounter for supervision of normal pregnancy, unspecified, unspecified trimester: Secondary | ICD-10-CM | POA: Insufficient documentation

## 2016-02-26 DIAGNOSIS — J984 Other disorders of lung: Secondary | ICD-10-CM | POA: Insufficient documentation

## 2016-02-26 LAB — OB RESULTS CONSOLE HIV ANTIBODY (ROUTINE TESTING): HIV: NONREACTIVE

## 2016-02-26 LAB — OB RESULTS CONSOLE RUBELLA ANTIBODY, IGM: Rubella: IMMUNE

## 2016-02-26 LAB — OB RESULTS CONSOLE HEPATITIS B SURFACE ANTIGEN: HEP B S AG: NEGATIVE

## 2016-02-26 LAB — OB RESULTS CONSOLE RPR: RPR: NONREACTIVE

## 2016-02-26 LAB — OB RESULTS CONSOLE ANTIBODY SCREEN: ANTIBODY SCREEN: NEGATIVE

## 2016-02-26 LAB — OB RESULTS CONSOLE ABO/RH: RH Type: NEGATIVE

## 2016-02-26 LAB — OB RESULTS CONSOLE GC/CHLAMYDIA
CHLAMYDIA, DNA PROBE: NEGATIVE
GC PROBE AMP, GENITAL: NEGATIVE

## 2016-03-21 DIAGNOSIS — Z6791 Unspecified blood type, Rh negative: Secondary | ICD-10-CM | POA: Insufficient documentation

## 2016-03-21 DIAGNOSIS — O09513 Supervision of elderly primigravida, third trimester: Secondary | ICD-10-CM | POA: Insufficient documentation

## 2016-08-23 ENCOUNTER — Inpatient Hospital Stay (HOSPITAL_COMMUNITY)
Admission: AD | Admit: 2016-08-23 | Payer: Managed Care, Other (non HMO) | Source: Ambulatory Visit | Admitting: Obstetrics and Gynecology

## 2016-09-16 LAB — OB RESULTS CONSOLE GBS: STREP GROUP B AG: NEGATIVE

## 2016-10-07 ENCOUNTER — Encounter (HOSPITAL_COMMUNITY): Payer: Self-pay | Admitting: *Deleted

## 2016-10-07 ENCOUNTER — Telehealth (HOSPITAL_COMMUNITY): Payer: Self-pay | Admitting: *Deleted

## 2016-10-07 NOTE — Telephone Encounter (Signed)
Preadmission screen  

## 2016-10-08 ENCOUNTER — Telehealth (HOSPITAL_COMMUNITY): Payer: Self-pay | Admitting: *Deleted

## 2016-10-08 ENCOUNTER — Encounter (HOSPITAL_COMMUNITY): Payer: Self-pay | Admitting: *Deleted

## 2016-10-08 NOTE — Telephone Encounter (Signed)
Preadmission screen  

## 2016-10-15 ENCOUNTER — Other Ambulatory Visit: Payer: Self-pay | Admitting: Obstetrics and Gynecology

## 2016-10-17 ENCOUNTER — Inpatient Hospital Stay (HOSPITAL_COMMUNITY): Payer: Medicaid Other

## 2016-10-17 ENCOUNTER — Encounter (HOSPITAL_COMMUNITY): Payer: Self-pay

## 2016-10-17 ENCOUNTER — Inpatient Hospital Stay (HOSPITAL_COMMUNITY)
Admission: RE | Admit: 2016-10-17 | Discharge: 2016-10-22 | DRG: 765 | Disposition: A | Discharge status: Home / Self Care | Payer: Medicaid Other | Source: Ambulatory Visit | Attending: Obstetrics and Gynecology | Admitting: Obstetrics and Gynecology

## 2016-10-17 VITALS — BP 125/80 | HR 80 | Temp 98.9°F | Resp 18 | Ht 63.0 in | Wt 217.0 lb

## 2016-10-17 DIAGNOSIS — O99214 Obesity complicating childbirth: Secondary | ICD-10-CM | POA: Diagnosis present

## 2016-10-17 DIAGNOSIS — O4103X0 Oligohydramnios, third trimester, not applicable or unspecified: Secondary | ICD-10-CM | POA: Diagnosis present

## 2016-10-17 DIAGNOSIS — A6000 Herpesviral infection of urogenital system, unspecified: Secondary | ICD-10-CM | POA: Diagnosis present

## 2016-10-17 DIAGNOSIS — Z6791 Unspecified blood type, Rh negative: Secondary | ICD-10-CM | POA: Diagnosis not present

## 2016-10-17 DIAGNOSIS — O9902 Anemia complicating childbirth: Secondary | ICD-10-CM | POA: Diagnosis present

## 2016-10-17 DIAGNOSIS — O26893 Other specified pregnancy related conditions, third trimester: Secondary | ICD-10-CM | POA: Diagnosis present

## 2016-10-17 DIAGNOSIS — D649 Anemia, unspecified: Secondary | ICD-10-CM | POA: Diagnosis present

## 2016-10-17 DIAGNOSIS — Z6838 Body mass index (BMI) 38.0-38.9, adult: Secondary | ICD-10-CM | POA: Diagnosis not present

## 2016-10-17 DIAGNOSIS — O9832 Other infections with a predominantly sexual mode of transmission complicating childbirth: Secondary | ICD-10-CM | POA: Diagnosis present

## 2016-10-17 DIAGNOSIS — Z98891 History of uterine scar from previous surgery: Secondary | ICD-10-CM

## 2016-10-17 DIAGNOSIS — O365930 Maternal care for other known or suspected poor fetal growth, third trimester, not applicable or unspecified: Secondary | ICD-10-CM | POA: Diagnosis present

## 2016-10-17 DIAGNOSIS — Z3A40 40 weeks gestation of pregnancy: Secondary | ICD-10-CM

## 2016-10-17 DIAGNOSIS — E669 Obesity, unspecified: Secondary | ICD-10-CM | POA: Diagnosis present

## 2016-10-17 DIAGNOSIS — O1414 Severe pre-eclampsia complicating childbirth: Secondary | ICD-10-CM | POA: Diagnosis present

## 2016-10-17 DIAGNOSIS — O320XX0 Maternal care for unstable lie, not applicable or unspecified: Secondary | ICD-10-CM

## 2016-10-17 DIAGNOSIS — O864 Pyrexia of unknown origin following delivery: Secondary | ICD-10-CM | POA: Diagnosis not present

## 2016-10-17 DIAGNOSIS — O1490 Unspecified pre-eclampsia, unspecified trimester: Secondary | ICD-10-CM | POA: Diagnosis present

## 2016-10-17 HISTORY — DX: Herpesviral infection, unspecified: B00.9

## 2016-10-17 LAB — CBC
HCT: 34.2 % — ABNORMAL LOW (ref 36.0–46.0)
Hemoglobin: 11.7 g/dL — ABNORMAL LOW (ref 12.0–15.0)
MCH: 29.1 pg (ref 26.0–34.0)
MCHC: 34.2 g/dL (ref 30.0–36.0)
MCV: 85.1 fL (ref 78.0–100.0)
PLATELETS: 156 10*3/uL (ref 150–400)
RBC: 4.02 MIL/uL (ref 3.87–5.11)
RDW: 15.1 % (ref 11.5–15.5)
WBC: 8.2 10*3/uL (ref 4.0–10.5)

## 2016-10-17 LAB — TYPE AND SCREEN
ABO/RH(D): O NEG
Antibody Screen: NEGATIVE

## 2016-10-17 LAB — AMNISURE RUPTURE OF MEMBRANE (ROM) NOT AT ARMC: Amnisure ROM: NEGATIVE

## 2016-10-17 LAB — ABO/RH: ABO/RH(D): O NEG

## 2016-10-17 MED ORDER — OXYTOCIN 40 UNITS IN LACTATED RINGERS INFUSION - SIMPLE MED
2.5000 [IU]/h | INTRAVENOUS | Status: DC
Start: 2016-10-17 — End: 2016-10-22
  Filled 2016-10-17: qty 1000

## 2016-10-17 MED ORDER — FENTANYL CITRATE (PF) 100 MCG/2ML IJ SOLN
50.0000 ug | INTRAMUSCULAR | Status: AC | PRN
  Administered 2016-10-18 (×3): 100 ug via INTRAVENOUS
  Administered 2016-10-18: 50 ug via INTRAVENOUS
  Administered 2016-10-18 (×2): 100 ug via INTRAVENOUS
  Filled 2016-10-17 (×6): qty 2

## 2016-10-17 MED ORDER — TERBUTALINE SULFATE 1 MG/ML IJ SOLN: 0.2500 mg | Freq: Once | INTRAMUSCULAR | Status: DC | PRN

## 2016-10-17 MED ORDER — SOD CITRATE-CITRIC ACID 500-334 MG/5ML PO SOLN
30.0000 mL | ORAL | Status: DC | PRN
  Administered 2016-10-19: 30 mL via ORAL
  Filled 2016-10-17: qty 15

## 2016-10-17 MED ORDER — OXYTOCIN BOLUS FROM INFUSION: 500.0000 mL | Freq: Once | INTRAVENOUS | Status: DC

## 2016-10-17 MED ORDER — ONDANSETRON HCL 4 MG/2ML IJ SOLN: 4.0000 mg | Freq: Four times a day (QID) | INTRAMUSCULAR | Status: DC | PRN

## 2016-10-17 MED ORDER — ACETAMINOPHEN 325 MG PO TABS: 650.0000 mg | ORAL_TABLET | ORAL | Status: DC | PRN

## 2016-10-17 MED ORDER — LACTATED RINGERS IV SOLN
INTRAVENOUS | Status: DC
  Administered 2016-10-17 – 2016-10-18 (×4): via INTRAVENOUS
  Administered 2016-10-18: 125 mL/h via INTRAVENOUS
  Administered 2016-10-18 – 2016-10-19 (×3): via INTRAVENOUS

## 2016-10-17 MED ORDER — LIDOCAINE HCL (PF) 1 % IJ SOLN
30.0000 mL | INTRAMUSCULAR | Status: DC | PRN
  Filled 2016-10-17: qty 30

## 2016-10-17 MED ORDER — MISOPROSTOL 25 MCG QUARTER TABLET
25.0000 ug | ORAL_TABLET | ORAL | Status: DC | PRN
  Administered 2016-10-17: 25 ug via VAGINAL
  Filled 2016-10-17 (×2): qty 1

## 2016-10-17 MED ORDER — LACTATED RINGERS IV SOLN
500.0000 mL | INTRAVENOUS | Status: DC | PRN
  Administered 2016-10-17: 500 mL via INTRAVENOUS
  Administered 2016-10-17: 250 mL via INTRAVENOUS
  Administered 2016-10-19: 200 mL via INTRAVENOUS

## 2016-10-17 MED ORDER — OXYTOCIN 40 UNITS IN LACTATED RINGERS INFUSION - SIMPLE MED
1.0000 m[IU]/min | INTRAVENOUS | Status: DC
  Administered 2016-10-17: 1 m[IU]/min via INTRAVENOUS
  Administered 2016-10-19: 19 m[IU]/min via INTRAVENOUS
  Administered 2016-10-19: 20 m[IU]/min via INTRAVENOUS
  Filled 2016-10-17: qty 1000

## 2016-10-17 NOTE — H&P (Signed)
Brenda Arellano is a 41 y.o. female presenting [facility] medical [facility]. 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 Abnormal Pap smear   . 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 Vaginal Pap smear, abnormal    Past Surgical History:  Procedure Laterality Date  . CRYOTHERAPY    . Right hypoplastic lung  1987   Family History: family history includes Arthritis in her mother; Diabetes in her maternal grandmother; Heart disease in her maternal grandmother; Kidney disease in her father; Stroke in her maternal grandmother; Thyroid disease in her maternal grandmother. Social History:  reports that she has never smoked. She has never used smokeless tobacco. She reports that she drinks alcohol. She reports that she does not use drugs.     Maternal Diabetes: No Genetic Screening: Normal Maternal Ultrasounds/Referrals: Normal Fetal Ultrasounds or other Referrals:  None Maternal Substance Abuse:  No Significant Maternal Medications:  None Valtrex Significant Maternal Lab Results:  Lab values include: Group B Strep negative, Rh negative, Other:  HSV 2Other Comments:  None  Review of Systems  All other systems reviewed and are negative.  Maternal Medical History:  Reason for admission: Induction - AMA  Contractions: Frequency: rare.    Fetal activity: Perceived fetal activity is normal.   Last perceived fetal movement was within the past hour.      Dilation: Fingertip Effacement (%): Thick Exam by:: M. Early, RN Blood pressure 121/79, pulse 63, temperature 98.3 F (36.8 C), temperature source Oral, resp. rate 18, height 5\' 3"  (1.6 m), weight 217 lb (98.4 kg), last menstrual  period 01/09/2016. Maternal Exam:  Uterine Assessment: Contraction frequency is rare.   Abdomen: Patient reports no abdominal tenderness. Estimated fetal weight is 7 lbs 9 oz.   Fetal presentation: vertex  Introitus: Normal vulva. Normal vagina.  No lesions noted  Pelvis: adequate for delivery.   Cervix: Cervix evaluated by sterile speculum exam and digital exam.     Fetal Exam Fetal Monitor Review: Mode: ultrasound.   Variability: moderate (6-25 bpm).   Pattern: accelerations present.    Fetal State Assessment: Category I - tracings are normal.     Physical Exam  Nursing note and vitals reviewed. Constitutional: She is oriented to person, place, and time. She appears well-developed and well-nourished.  HENT:  Head: Normocephalic and atraumatic.  Neck: Normal range of motion.  Cardiovascular: Normal rate and regular rhythm.   Respiratory: Effort normal and breath sounds normal.  GI: Soft. Bowel sounds are normal.  Genitourinary: Vagina normal.  Musculoskeletal: Normal range of motion.  Neurological: She is alert and oriented to person, place, and time.  Skin: Skin is warm and dry.  Psychiatric: She has a normal mood and affect. Her behavior is normal. Judgment and thought content normal.    Prenatal labs: ABO, Rh: --/--/O NEG (09/09 1420) Antibody: NEG (09/09 1420) Rubella: Immune (01/18 0000) RPR: Nonreactive (01/18 0000)  HBsAg: Negative (01/18 0000)  HIV: Non-reactive (01/18 0000)  GBS: Negative (08/09 0000)  HSV 2 -Positive Assessment/Plan: IUP @ 40+2 AMA  HSV-2 Oligohydramnios- 2   Plan: cervical ripening- Cytotec First dose 600pm          Amnisure to confirm membrane status- Negative          Ultrasound to confirm presentation- VTX          Sterile Spec Exam reveals no herpetic lesions      Lawson Fiscal A Taevyn Hausen CNM 10/17/2016, 5:24 PM

## 2016-10-17 NOTE — Anesthesia Pain Management Evaluation Note (Signed)
  CRNA Pain Management Visit Note  Patient: Brenda Arellano, 41 y.o., female  "[facility] We have an anesthesia team available at all times to provide care throughout the hospital, including epidural management and anesthesia for C-section. I don't know your plan for the delivery whether it a natural birth, water birth, IV sedation, nitrous supplementation, doula or epidural, but we want to meet your pain goals."   1.Was your pain managed to your expectations on prior hospitalizations?   Yes   2.What is your expectation for pain management during this hospitalization?     Epidural  3.How can we help you reach that goal? Support prn  Record the patient's initial score and the patient's pain goal.   Pain: 0  Pain Goal: 7 [facility] wants you to be able to say your pain was always managed very well.  [facility] Care SystemWRINKLE,Brenda Arellano 10/17/2016

## 2016-10-17 NOTE — Progress Notes (Signed)
Brenda Arellano MRN: [id number]  Subjective: -Patient sitting up in bed.  Reports perception of contractions, but coping well.    Objective: BP 121/78   Pulse 67   Temp 97.7 F (36.5 C) (Oral)   Resp 18   Ht 5\' 3"  (1.6 m)   Wt 98.4 kg (217 lb)   LMP 01/09/2016   BMI 38.44 kg/m  No intake/output data recorded. No intake/output data recorded.  Fetal Monitoring: FHT: 150 bpm, Mod Var, -Decels, +Accels UC: Q1-253min, palpates mild    Vaginal Exam: SVE:   Dilation: Closed Effacement (%): Thick Station: -3 Exam by:: B. parks, RN Membranes:Intact Internal Monitors: None  Augmentation/Induction: Pitocin:381mUn/min  Cytotec: S/P 1 Dose  Assessment:  IUP at 40.2wks Cat I FT  IOL  Plan: -Discussed pitocin titration -Discussed pain management with IV meds -Encouraged patient to rest -Will allow pitocin to ripen cervix throughout the night and will reassess at 0600 -Nurse to assess cervix if patient requests pain medication or epidural.  -No q/c -Continue other mgmt as ordered   Valma CavaJessica L Yvonne Stopher,MSN, CNM 10/17/2016, 11:40 PM

## 2016-10-17 NOTE — Progress Notes (Addendum)
Brenda Arellano R Stipe MRN: [id number]  Subjective: -Care assumed of 41 y.o. G1P0 at 6638w2d who presents for IOL s/t AMA. Patient is under the care of [facility] and pregnancy history significant for AMA, Incidental finding of Oligo on presentation US, and HSV. In room to meet acquaintance of patient and family.  Patient denies pain and reports fetal movement.    Objective: BP 121/79   Pulse 63   Temp 98.3 F (36.8 C) (Oral)   Resp 18   Ht 5\' 3"  (1.6 m)   Wt 98.4 kg (217 lb)   LMP 01/09/2016   BMI 38.44 kg/m  No intake/output data recorded. No intake/output data recorded.  Fetal Monitoring: FHT: 150 bpm, Mod Var, +Variable Decels, +Accels UC: None graphed  Physical Exam: General appearance: alert, well appearing, and in no distress. Chest: normal rate and regular rhythm.  clear to auscultation, no wheezes, rales or rhonchi, symmetric air entry. Abdominal exam: Soft, NT, Gravid-AGA, BS x 4. Extremities: Non pitting edema Skin exam: Warm Dry  Vaginal Exam: SVE:   Dilation: Closed Effacement (%): Thick Exam by:: Clemmons, CNM Membranes:Intact Internal Monitors: None  Augmentation/Induction: Pitocin:None Cytotec: S/P 1st Dose at 1820  Assessment:  IUP at 40.2wks Cat II FT  AMA IOL-Cervical Ripening GBS Negative  Plan: -Patient in high fowlers, but will perform position change after patient completes meal -Discussed cytotec placement and foley bulb placement -Discussed foley bulb insertion including r/b, placement, associated discomfort, initiation of pitocin, and removal. -Addressed q/c regarding POC -Plan for cytotec placement at 2230 -Will reassess at 0230 for foley bulb placement -Continue other mgmt as ordered  Valma CavaJessica L Tyrelle Raczka,MSN, CNM 10/17/2016, 7:53 PM   Addendum Strip reviewed with Dr. Enid BaasEV Decision to discontinue cytotec and start pitocin at 2230 Patient and nurse updated on POC No q/c Pitocin orders placed  Cherre RobinsJessica L Damiana Berrian MSN, CNM 10/17/2016 9:37 PM

## 2016-10-18 ENCOUNTER — Inpatient Hospital Stay (HOSPITAL_COMMUNITY): Payer: Medicaid Other | Admitting: Anesthesiology

## 2016-10-18 LAB — RPR: RPR Ser Ql: NONREACTIVE

## 2016-10-18 MED ORDER — EPHEDRINE 5 MG/ML INJ: 10.0000 mg | INTRAVENOUS | Status: DC | PRN

## 2016-10-18 MED ORDER — PHENYLEPHRINE 40 MCG/ML (10ML) SYRINGE FOR IV PUSH (FOR BLOOD PRESSURE SUPPORT)
80.0000 ug | PREFILLED_SYRINGE | INTRAVENOUS | Status: DC | PRN
  Filled 2016-10-18: qty 10

## 2016-10-18 MED ORDER — FENTANYL 2.5 MCG/ML BUPIVACAINE 1/10 % EPIDURAL INFUSION (WH - ANES)
14.0000 mL/h | INTRAMUSCULAR | Status: DC | PRN
  Administered 2016-10-18: 12 mL/h via EPIDURAL
  Administered 2016-10-19 (×2): 14 mL/h via EPIDURAL
  Filled 2016-10-18 (×3): qty 100

## 2016-10-18 MED ORDER — FENTANYL CITRATE (PF) 100 MCG/2ML IJ SOLN
100.0000 ug | INTRAMUSCULAR | Status: DC | PRN
  Administered 2016-10-18 (×3): 100 ug via INTRAVENOUS
  Filled 2016-10-18 (×3): qty 2

## 2016-10-18 MED ORDER — LACTATED RINGERS IV SOLN
500.0000 mL | Freq: Once | INTRAVENOUS | Status: AC
  Administered 2016-10-18: 500 mL via INTRAVENOUS

## 2016-10-18 MED ORDER — LIDOCAINE HCL (PF) 1 % IJ SOLN
INTRAMUSCULAR | Status: DC | PRN
  Administered 2016-10-18 (×2): 5 mL via EPIDURAL

## 2016-10-18 MED ORDER — PHENYLEPHRINE 40 MCG/ML (10ML) SYRINGE FOR IV PUSH (FOR BLOOD PRESSURE SUPPORT): 80.0000 ug | PREFILLED_SYRINGE | INTRAVENOUS | Status: DC | PRN

## 2016-10-18 MED ORDER — DIPHENHYDRAMINE HCL 50 MG/ML IJ SOLN: 12.5000 mg | INTRAMUSCULAR | Status: DC | PRN

## 2016-10-18 NOTE — Progress Notes (Signed)
  Subjective: Aware of more contractions, breathing with some.   Objective: BP 123/76   Pulse (!) 59   Temp 98.3 F (36.8 C) (Oral)   Resp 16   Ht 5\' 3"  (1.6 m)   Wt 98.4 kg (217 lb)   LMP 01/09/2016   BMI 38.44 kg/m  No intake/output data recorded. No intake/output data recorded.  FHT: Category 1 UC:   Irregular, q 3-6 min SVE:   Dilation: Closed Effacement (%): 80 Station: -1, -2 Exam by:: V Rochelle Larue CNM  External os 1 cm, internal os FT--unable to insert my finger Pitocin at 10 mu/min  Foley bulb inserted with speculum--inflated with 60 cc fluid.  Assessment:  Induction for AMA Oligohydramnios GBS negative O negative BMI 38  Plan: Continue pitocin, await foley extrusion. Dr. Stefano GaulStringer updated.  Nigel BridgemanLATHAM, Brenda Arellano CNM 10/18/2016, 4:17 PM

## 2016-10-18 NOTE — Anesthesia Preprocedure Evaluation (Addendum)
Anesthesia Evaluation  Patient identified by MRN, date of birth, ID band Patient awake    Reviewed: Allergy & Precautions, NPO status , Patient's Chart, lab work & pertinent test results  History of Anesthesia Complications Negative for: history of anesthetic complications  Airway Mallampati: II  TM Distance: >3 FB Neck ROM: Full    Dental  (+) Dental Advisory Given   Pulmonary  1987 hypoplastic R lung: s/p thoracotomy at age 41 yo    breath sounds clear to auscultation       Cardiovascular negative cardio ROS   Rhythm:Regular Rate:Normal     Neuro/Psych negative neurological ROS     GI/Hepatic negative GI ROS, Neg liver ROS,   Endo/Other  Obesity  Renal/GU negative Renal ROS     Musculoskeletal   Abdominal (+) + obese,   Peds  Hematology plt 156k   Anesthesia Other Findings   Reproductive/Obstetrics (+) Pregnancy                           Anesthesia Physical Anesthesia Plan  ASA: III  Anesthesia Plan: Epidural   Post-op Pain Management:    Induction:   PONV Risk Score and Plan: Treatment may vary due to age or medical condition  Airway Management Planned: Natural Airway  Additional Equipment:   Intra-op Plan:   Post-operative Plan:   Informed Consent: I have reviewed the patients History and Physical, chart, labs and discussed the procedure including the risks, benefits and alternatives for the proposed anesthesia with the patient or authorized representative who has indicated his/her understanding and acceptance.   Dental advisory given  Plan Discussed with:   Anesthesia Plan Comments: (Patient identified. Risks/Benefits/Options discussed with patient including but not limited to bleeding, infection, nerve damage, paralysis, failed block, incomplete pain control, headache, blood pressure changes, nausea, vomiting, reactions to medication both or allergic, itching and  postpartum back pain. Confirmed with bedside nurse the patient's most recent platelet count. Confirmed with patient that they are not currently taking any anticoagulation, have any bleeding history or any family history of bleeding disorders. Patient expressed understanding and wished to proceed. All questions were answered. )       Anesthesia Quick Evaluation

## 2016-10-18 NOTE — Anesthesia Procedure Notes (Signed)
Epidural Patient location during procedure: OB Start time: 10/18/2016 9:19 PM End time: 10/18/2016 10:38 PM  Staffing Anesthesiologist: Jairo BenJACKSON, Mikahla Wisor Performed: anesthesiologist   Preanesthetic Checklist Completed: patient identified, surgical consent, pre-op evaluation, timeout performed, IV checked, risks and benefits discussed and monitors and equipment checked  Epidural Patient position: sitting Prep: site prepped and draped and DuraPrep Patient monitoring: blood pressure, continuous pulse ox and heart rate Approach: midline Location: L3-L4 Injection technique: LOR air  Needle:  Needle type: Tuohy  Needle gauge: 17 G Needle length: 9 cm Needle insertion depth: 5 cm Catheter type: closed end flexible Catheter size: 19 Gauge Catheter at skin depth: 10 cm Test dose: negative (1% lidocaine)  Assessment Events: blood not aspirated, injection not painful, no injection resistance, negative IV test and no paresthesia  Additional Notes Pt identified in Labor room.  Monitors applied. Working IV access confirmed. Sterile prep, drape lumbar spine.  1% lido local L 3,4.  #17ga Touhy LOR air at 5 cm L 3,4, cath in easily to 10 cm skin. Test dose OK, cath dosed and infusion begun.  Patient asymptomatic, VSS, no heme aspirated, tolerated well.  Sandford Craze Magdelene Ruark, MDReason for block:procedure for pain

## 2016-10-18 NOTE — Progress Notes (Addendum)
Nanine Meansara R Lips [id number]  Subjective: Strip and Chart Reviewed. Nurse reports patient breathing with contractions and received IV pain medication.  Objective:  Vitals:   10/18/16 0101 10/18/16 0131 10/18/16 0200 10/18/16 0201  BP: 130/78 (!) 142/78  (!) 141/73  Pulse: (!) 59 (!) 59  (!) 54  Resp:      Temp:   97.9 F (36.6 C)   TempSrc:   Oral   Weight:      Height:        FHR: 125 bpm, Min Var, -Decels, -Accels UC: Uterine Irritability   Assessment: IUP at 5568w3d Cat I FT IOL  Plan: -Continue pitocin titration as appropriate -Will assess cervix for foley balloon at 0600 -Continue other mgmt as ordered  Sabas SousJ. Sherea Liptak, CNM 10/18/2016 3:32 AM  Addendum Nurse call reports patient with urge to push.  In room to assess.  Patient reports feelings of rectal pressure.  VE: 0/Thick/-3, Soft-Cervical os with "dimple like" feel  Patient informed of VE and encouraged to attempt bowel movement  Continue pitocin titration as appropriate Okay for IV fentanyl Will assess patient status at 0600.  Cherre RobinsJessica L Sherryl Valido MSN, CNM 10/18/2016 3:57 AM

## 2016-10-18 NOTE — Progress Notes (Signed)
Admission nutrition screen triggered for unintentional weight loss > 10 lbs within the last month. PNR indicates weight is down 7 lbs from 8/22 visit.  It is noted that there was 3+ edema There is no mention of poor appetite, nausea or vomiting . Patients chart reviewed and assessed  for nutritional risk. Patient is determined to be at low nutrition  risk.   If pt presents with poor appetite after induction, order Ensure BID  Elisabeth CaraKatherine Daqwan Dougal M.Odis LusterEd. R.D. LDN Neonatal Nutrition Support Specialist/RD III Pager [phone]      Phone [phone]

## 2016-10-18 NOTE — Anesthesia Pain Management Evaluation Note (Signed)
  CRNA Pain Management Visit Note  Patient: Brenda Arellano, 41 y.o., female  "Hello [facility]. We have an anesthesia team available at all times to provide care throughout the hospital, including epidural management and anesthesia for C-section. I don't know your plan for the delivery whether it a natural birth, water birth, IV sedation, nitrous supplementation, doula or epidural, but we want to meet your pain goals."   1.Was your pain managed to your expectations on prior hospitalizations?   No prior hospitalizations  2.What is your expectation for pain management during this hospitalization?     Epidural, IV pain meds and Nitrous Oxide  3.How can we help you reach that goal? Be available  Record the patient's initial score and the patient's pain goal.   Pain: 2  Pain Goal: 5 [facility] wants you to be able to say your pain was always managed very well.  [facility] Hell[facility] ChapelMERRITT,Gerell Fortson 10/18/2016

## 2016-10-18 NOTE — Progress Notes (Signed)
  Subjective: Aware of contractions, received Fentanyl at 0206 and 0356.  FOB, mother, and friend at bedside.  Objective: BP 131/73   Pulse (!) 59   Temp 97.9 F (36.6 C) (Oral)   Resp 20   Ht 5\' 3"  (1.6 m)   Wt 98.4 kg (217 lb)   LMP 01/09/2016   BMI 38.44 kg/m  No intake/output data recorded. No intake/output data recorded.   Vitals:   10/18/16 0601 10/18/16 0631 10/18/16 0731 10/18/16 0758  BP: 132/76 140/82 132/81 131/73  Pulse: (!) 57 (!) 57 61 (!) 59  Resp:   16 20  Temp:      TempSrc:      Weight:      Height:        FHT: Category 1 UC:   irregular, every 2-5 minutes SVE:   Dilation: Closed Effacement (%): 80 Station: -1, -2 Exam by:: V Caston Coopersmith CNM  Cervix soft, much thinner, but unable to get finger in cervix.  Patient has hx of cryosurgery. Pitocin at 3 mu/min.  Assessment:  Induction for AMA Oligohydramnios GBS negative RH negative Hx HSV, no recent/current lesions  Plan: Continue pitocin at present.  Brenda BridgemanLATHAM, Brenda Arellano CNM 10/18/2016, 8:06 AM

## 2016-10-19 ENCOUNTER — Encounter (HOSPITAL_COMMUNITY): Payer: Self-pay | Admitting: Anesthesiology

## 2016-10-19 ENCOUNTER — Encounter (HOSPITAL_COMMUNITY)
Admission: RE | Disposition: A | Discharge status: Home / Self Care | Payer: Self-pay | Source: Ambulatory Visit | Attending: Obstetrics and Gynecology

## 2016-10-19 ENCOUNTER — Encounter (HOSPITAL_COMMUNITY): Payer: Self-pay

## 2016-10-19 DIAGNOSIS — O1490 Unspecified pre-eclampsia, unspecified trimester: Secondary | ICD-10-CM | POA: Diagnosis present

## 2016-10-19 LAB — CREATININE, SERUM
Creatinine, Ser: 1.11 mg/dL — ABNORMAL HIGH (ref 0.44–1.00)
GFR calc non Af Amer: >60 mL/min (ref >60)

## 2016-10-19 LAB — CBC
HEMATOCRIT: 31.7 % — AB (ref 36.0–46.0)
Hemoglobin: 10.9 g/dL — ABNORMAL LOW (ref 12.0–15.0)
MCH: 29.4 pg (ref 26.0–34.0)
MCHC: 34.4 g/dL (ref 30.0–36.0)
MCV: 85.4 fL (ref 78.0–100.0)
PLATELETS: 142 10*3/uL — AB (ref 150–400)
RBC: 3.71 MIL/uL — ABNORMAL LOW (ref 3.87–5.11)
RDW: 15.3 % (ref 11.5–15.5)
WBC: 18 10*3/uL — AB (ref 4.0–10.5)

## 2016-10-19 LAB — PROTEIN / CREATININE RATIO, URINE
Creatinine, Urine: 69 mg/dL
Protein Creatinine Ratio: 0.51 mg/mg{Cre} — ABNORMAL HIGH (ref 0.00–0.15)
Total Protein, Urine: 35 mg/dL

## 2016-10-19 LAB — MAGNESIUM: Magnesium: 5.5 mg/dL — ABNORMAL HIGH (ref 1.7–2.4)

## 2016-10-19 SURGERY — Surgical Case
Anesthesia: Epidural | Wound class: Clean Contaminated

## 2016-10-19 MED ORDER — HYDRALAZINE HCL 20 MG/ML IJ SOLN: 10.0000 mg | Freq: Once | INTRAMUSCULAR | Status: DC | PRN

## 2016-10-19 MED ORDER — MORPHINE SULFATE (PF) 0.5 MG/ML IJ SOLN
INTRAMUSCULAR | Status: AC
  Filled 2016-10-19: qty 10

## 2016-10-19 MED ORDER — OXYTOCIN 40 UNITS IN LACTATED RINGERS INFUSION - SIMPLE MED: 2.5000 [IU]/h | INTRAVENOUS | Status: AC

## 2016-10-19 MED ORDER — COCONUT OIL OIL
1.0000 "application " | TOPICAL_OIL | Status: DC | PRN
  Administered 2016-10-22: 1 via TOPICAL
  Filled 2016-10-19: qty 120

## 2016-10-19 MED ORDER — CEFAZOLIN SODIUM-DEXTROSE 2-3 GM-% IV SOLR
INTRAVENOUS | Status: DC | PRN
  Administered 2016-10-19: 2 g via INTRAVENOUS

## 2016-10-19 MED ORDER — ACETAMINOPHEN 650 MG RE SUPP
650.0000 mg | RECTAL | Status: AC
Start: 2016-10-19 — End: 2016-10-19
  Administered 2016-10-19: 650 mg via RECTAL
  Filled 2016-10-19: qty 1

## 2016-10-19 MED ORDER — DEXAMETHASONE SODIUM PHOSPHATE 4 MG/ML IJ SOLN
INTRAMUSCULAR | Status: AC
  Filled 2016-10-19: qty 1

## 2016-10-19 MED ORDER — OXYTOCIN 10 UNIT/ML IJ SOLN
INTRAVENOUS | Status: DC | PRN
  Administered 2016-10-19: 40 [IU] via INTRAVENOUS

## 2016-10-19 MED ORDER — MAGNESIUM SULFATE 40 G IN LACTATED RINGERS - SIMPLE
2.0000 g/h | INTRAVENOUS | Status: DC
  Administered 2016-10-20: 2 g/h via INTRAVENOUS
  Filled 2016-10-19: qty 40
  Filled 2016-10-19 (×2): qty 500

## 2016-10-19 MED ORDER — PHENYLEPHRINE 40 MCG/ML (10ML) SYRINGE FOR IV PUSH (FOR BLOOD PRESSURE SUPPORT)
PREFILLED_SYRINGE | INTRAVENOUS | Status: AC
  Filled 2016-10-19: qty 10

## 2016-10-19 MED ORDER — BUPIVACAINE-EPINEPHRINE 0.5% -1:200000 IJ SOLN
INTRAMUSCULAR | Status: DC | PRN
  Administered 2016-10-19: 10 mL

## 2016-10-19 MED ORDER — SODIUM BICARBONATE 8.4 % IV SOLN
INTRAVENOUS | Status: DC | PRN
  Administered 2016-10-19 (×2): 5 mL via EPIDURAL
  Administered 2016-10-19: 6 mL via EPIDURAL

## 2016-10-19 MED ORDER — ACETAMINOPHEN 325 MG PO TABS: 650.0000 mg | ORAL_TABLET | ORAL | Status: DC | PRN

## 2016-10-19 MED ORDER — SCOPOLAMINE 1 MG/3DAYS TD PT72
MEDICATED_PATCH | TRANSDERMAL | Status: AC
  Filled 2016-10-19: qty 1

## 2016-10-19 MED ORDER — MEASLES, MUMPS & RUBELLA VAC ~~LOC~~ INJ: 0.5000 mL | INJECTION | Freq: Once | SUBCUTANEOUS | Status: DC

## 2016-10-19 MED ORDER — MEDROXYPROGESTERONE ACETATE 150 MG/ML IM SUSP: 150.0000 mg | INTRAMUSCULAR | Status: DC | PRN

## 2016-10-19 MED ORDER — PRENATAL MULTIVITAMIN CH
1.0000 | ORAL_TABLET | Freq: Every day | ORAL | Status: DC
  Administered 2016-10-21: 1 via ORAL
  Filled 2016-10-19 (×3): qty 1

## 2016-10-19 MED ORDER — SIMETHICONE 80 MG PO CHEW
80.0000 mg | CHEWABLE_TABLET | Freq: Three times a day (TID) | ORAL | Status: DC
  Administered 2016-10-19 – 2016-10-22 (×9): 80 mg via ORAL
  Filled 2016-10-19 (×10): qty 1

## 2016-10-19 MED ORDER — DIBUCAINE 1 % RE OINT: 1.0000 "application " | TOPICAL_OINTMENT | RECTAL | Status: DC | PRN

## 2016-10-19 MED ORDER — LACTATED RINGERS IV SOLN
INTRAVENOUS | Status: DC | PRN
  Administered 2016-10-19: 15:00:00 via INTRAVENOUS

## 2016-10-19 MED ORDER — HYDROMORPHONE HCL 2 MG PO TABS: 2.0000 mg | ORAL_TABLET | ORAL | Status: DC | PRN

## 2016-10-19 MED ORDER — PHENYLEPHRINE HCL 10 MG/ML IJ SOLN
INTRAMUSCULAR | Status: DC | PRN
  Administered 2016-10-19 (×2): 40 ug via INTRAVENOUS

## 2016-10-19 MED ORDER — SENNOSIDES-DOCUSATE SODIUM 8.6-50 MG PO TABS
2.0000 | ORAL_TABLET | ORAL | Status: DC
  Administered 2016-10-20 – 2016-10-22 (×2): 2 via ORAL
  Filled 2016-10-19 (×3): qty 2

## 2016-10-19 MED ORDER — BUPIVACAINE-EPINEPHRINE (PF) 0.5% -1:200000 IJ SOLN
INTRAMUSCULAR | Status: AC
  Filled 2016-10-19: qty 30

## 2016-10-19 MED ORDER — LIDOCAINE-EPINEPHRINE (PF) 2 %-1:200000 IJ SOLN
INTRAMUSCULAR | Status: AC
  Filled 2016-10-19: qty 20

## 2016-10-19 MED ORDER — ZOLPIDEM TARTRATE 5 MG PO TABS: 5.0000 mg | ORAL_TABLET | Freq: Every evening | ORAL | Status: DC | PRN

## 2016-10-19 MED ORDER — CEFAZOLIN SODIUM-DEXTROSE 2-4 GM/100ML-% IV SOLN
2.0000 g | INTRAVENOUS | Status: DC
  Filled 2016-10-19: qty 100

## 2016-10-19 MED ORDER — SIMETHICONE 80 MG PO CHEW
80.0000 mg | CHEWABLE_TABLET | ORAL | Status: DC | PRN
  Filled 2016-10-19: qty 1

## 2016-10-19 MED ORDER — MORPHINE SULFATE (PF) 0.5 MG/ML IJ SOLN
INTRAMUSCULAR | Status: DC | PRN
  Administered 2016-10-19: 3 mg via EPIDURAL

## 2016-10-19 MED ORDER — OXYTOCIN 10 UNIT/ML IJ SOLN
INTRAMUSCULAR | Status: AC
  Filled 2016-10-19: qty 4

## 2016-10-19 MED ORDER — LACTATED RINGERS IV SOLN
INTRAVENOUS | Status: DC
  Administered 2016-10-19: 150 mL via INTRAUTERINE

## 2016-10-19 MED ORDER — MENTHOL 3 MG MT LOZG: 1.0000 | LOZENGE | OROMUCOSAL | Status: DC | PRN

## 2016-10-19 MED ORDER — LACTATED RINGERS IV SOLN
INTRAVENOUS | Status: DC | PRN
  Administered 2016-10-19: 14:00:00 via INTRAVENOUS

## 2016-10-19 MED ORDER — SIMETHICONE 80 MG PO CHEW
80.0000 mg | CHEWABLE_TABLET | ORAL | Status: DC
  Administered 2016-10-20 – 2016-10-22 (×3): 80 mg via ORAL
  Filled 2016-10-19 (×2): qty 1

## 2016-10-19 MED ORDER — LABETALOL HCL 5 MG/ML IV SOLN: 20.0000 mg | INTRAVENOUS | Status: DC | PRN

## 2016-10-19 MED ORDER — ONDANSETRON HCL 4 MG/2ML IJ SOLN
INTRAMUSCULAR | Status: DC | PRN
  Administered 2016-10-19: 4 mg via INTRAVENOUS

## 2016-10-19 MED ORDER — TETANUS-DIPHTH-ACELL PERTUSSIS 5-2.5-18.5 LF-MCG/0.5 IM SUSP: 0.5000 mL | Freq: Once | INTRAMUSCULAR | Status: DC

## 2016-10-19 MED ORDER — SCOPOLAMINE 1 MG/3DAYS TD PT72
MEDICATED_PATCH | TRANSDERMAL | Status: DC | PRN
  Administered 2016-10-19: 1 via TRANSDERMAL

## 2016-10-19 MED ORDER — DIPHENHYDRAMINE HCL 25 MG PO CAPS: 25.0000 mg | ORAL_CAPSULE | Freq: Four times a day (QID) | ORAL | Status: DC | PRN

## 2016-10-19 MED ORDER — PROMETHAZINE HCL 25 MG/ML IJ SOLN: 6.2500 mg | INTRAMUSCULAR | Status: DC | PRN

## 2016-10-19 MED ORDER — ENOXAPARIN SODIUM 40 MG/0.4ML ~~LOC~~ SOLN
40.0000 mg | SUBCUTANEOUS | Status: DC
  Administered 2016-10-20 – 2016-10-22 (×3): 40 mg via SUBCUTANEOUS
  Filled 2016-10-19 (×5): qty 0.4

## 2016-10-19 MED ORDER — LACTATED RINGERS IV SOLN
INTRAVENOUS | Status: DC
  Administered 2016-10-20 (×2): via INTRAVENOUS

## 2016-10-19 MED ORDER — FENTANYL CITRATE (PF) 100 MCG/2ML IJ SOLN: 25.0000 ug | INTRAMUSCULAR | Status: DC | PRN

## 2016-10-19 MED ORDER — ONDANSETRON HCL 4 MG/2ML IJ SOLN
INTRAMUSCULAR | Status: AC
  Filled 2016-10-19: qty 2

## 2016-10-19 MED ORDER — MAGNESIUM SULFATE BOLUS VIA INFUSION
4.0000 g | Freq: Once | INTRAVENOUS | Status: AC
  Administered 2016-10-19: 4 g via INTRAVENOUS
  Filled 2016-10-19: qty 500

## 2016-10-19 MED ORDER — WITCH HAZEL-GLYCERIN EX PADS: 1.0000 "application " | MEDICATED_PAD | CUTANEOUS | Status: DC | PRN

## 2016-10-19 SURGICAL SUPPLY — 42 items
CELLS DAT CNTRL 66122 CELL SVR (MISCELLANEOUS) ×1 IMPLANT
CHLORAPREP W/TINT 26ML (MISCELLANEOUS) ×3 IMPLANT
CLAMP CORD UMBIL (MISCELLANEOUS) IMPLANT
CLOTH BEACON ORANGE TIMEOUT ST (SAFETY) ×3 IMPLANT
CONTAINER PREFILL 10% NBF 15ML (MISCELLANEOUS) IMPLANT
DRAIN JACKSON PRT FLT 7MM (DRAIN) IMPLANT
DRSG OPSITE POSTOP 4X10 (GAUZE/BANDAGES/DRESSINGS) ×3 IMPLANT
ELECT REM PT RETURN 9FT ADLT (ELECTROSURGICAL) ×3
ELECTRODE REM PT RTRN 9FT ADLT (ELECTROSURGICAL) ×1 IMPLANT
EVACUATOR SILICONE 100CC (DRAIN) IMPLANT
EXTRACTOR VACUUM M CUP 4 TUBE (SUCTIONS) IMPLANT
EXTRACTOR VACUUM M CUP 4' TUBE (SUCTIONS)
GLOVE BIOGEL PI IND STRL 7.0 (GLOVE) ×1 IMPLANT
GLOVE BIOGEL PI IND STRL 8.5 (GLOVE) ×1 IMPLANT
GLOVE BIOGEL PI INDICATOR 7.0 (GLOVE) ×2
GLOVE BIOGEL PI INDICATOR 8.5 (GLOVE) ×2
GLOVE ECLIPSE 8.0 STRL XLNG CF (GLOVE) ×6 IMPLANT
GOWN STRL REUS W/TWL LRG LVL3 (GOWN DISPOSABLE) ×9 IMPLANT
KIT ABG SYR 3ML LUER SLIP (SYRINGE) IMPLANT
NEEDLE HYPO 22GX1.5 SAFETY (NEEDLE) ×3 IMPLANT
NEEDLE HYPO 25X5/8 SAFETYGLIDE (NEEDLE) IMPLANT
PACK C SECTION WH (CUSTOM PROCEDURE TRAY) ×3 IMPLANT
PAD ABD 8X7 1/2 STERILE (GAUZE/BANDAGES/DRESSINGS) ×3 IMPLANT
PAD OB MATERNITY 4.3X12.25 (PERSONAL CARE ITEMS) ×3 IMPLANT
PENCIL SMOKE EVAC W/HOLSTER (ELECTROSURGICAL) ×3 IMPLANT
RINGERS IRRIG 1000ML POUR BTL (IV SOLUTION) ×3 IMPLANT
RTRCTR WOUND ALEXIS 18CM MED (MISCELLANEOUS) ×3
SPONGE GAUZE 4X4 16PLY NONSTR (GAUZE/BANDAGES/DRESSINGS) ×6 IMPLANT
SUT MNCRL AB 3-0 PS2 27 (SUTURE) IMPLANT
SUT PLAIN 0 NONE (SUTURE) IMPLANT
SUT PLAIN 2 0 XLH (SUTURE) ×3 IMPLANT
SUT SILK 3 0 FS 1X18 (SUTURE) IMPLANT
SUT VIC AB 0 CT1 27 (SUTURE) ×4
SUT VIC AB 0 CT1 27XBRD ANBCTR (SUTURE) ×2 IMPLANT
SUT VIC AB 2-0 CTX 36 (SUTURE) ×6 IMPLANT
SUT VIC AB 3-0 CT1 27 (SUTURE)
SUT VIC AB 3-0 CT1 TAPERPNT 27 (SUTURE) IMPLANT
SUT VIC AB 3-0 SH 27 (SUTURE)
SUT VIC AB 3-0 SH 27X BRD (SUTURE) IMPLANT
SYR CONTROL 10ML LL (SYRINGE) ×3 IMPLANT
TOWEL OR 17X24 6PK STRL BLUE (TOWEL DISPOSABLE) ×3 IMPLANT
TRAY FOLEY BAG SILVER LF 14FR (SET/KITS/TRAYS/PACK) ×3 IMPLANT

## 2016-10-19 NOTE — Progress Notes (Signed)
  Subjective: No pain, but just feels uncomfortable in the bed.  Objective: BP 140/76   Pulse 89   Temp 100 F (37.8 C) (Oral)   Resp 20   Ht 5\' 3"  (1.6 m)   Wt 98.4 kg (217 lb)   LMP 01/09/2016   SpO2 97%   BMI 38.44 kg/m  I/O last 3 completed shifts: In: 5083.3 [I.V.:5083.3] Out: -  Total I/O In: 1097.7 [I.V.:1097.7] Out: 805 [Urine:805]   Vitals:   10/19/16 1234 10/19/16 1241 10/19/16 1251 10/19/16 1302  BP: 131/77 (!) 145/81  140/76  Pulse: 83 84  89  Resp: 18 16  20   Temp:   100 F (37.8 C)   TempSrc:   Oral   SpO2:      Weight:      Height:        FHT: Category 2--baseline 170, occasional lates, moderate variability UC:   q 3 min SVE:   Dilation: 4 Effacement (%): 90 Station: -1, 0 Exam by:: v Brenda Arellano--cervix more edematous now Pitocin at 16 mu/min Patient's body internally hot to exam.  Assessment:  Arrest of labor Fetal tachycardia Pre-eclampsia without severe features.  Plan: Recommended C/s, after consult with Dr. Stefano GaulStringer.Risks and benefits of cesarean were reviewed with patient and family, including anesthesia, bleeding, infection, and damage to other organs.  Patient and family seem to understand these risks and are in agreement with proceeding with cesarean. Will continue magnesium x 24 hours pp.  Nigel BridgemanLATHAM, Brenda Arellano CNM 10/19/2016, 1:44 PM

## 2016-10-19 NOTE — Progress Notes (Signed)
  Subjective: On right side, aware of UCs as pressure.  Objective: BP (!) 143/82   Pulse 79   Temp 98.8 F (37.1 C) (Oral)   Resp 17   Ht 5\' 3"  (1.6 m)   Wt 98.4 kg (217 lb)   LMP 01/09/2016   SpO2 97%   BMI 38.44 kg/m  I/O last 3 completed shifts: In: 5083.3 [I.V.:5083.3] Out: -  Total I/O In: 604.9 [I.V.:604.9] Out: 630 [Urine:630]   Vitals:   10/19/16 0834 10/19/16 0900 10/19/16 1000 10/19/16 1033  BP: 140/89 (!) 144/83 (!) 144/87 (!) 143/82  Pulse: 81 84 99 79  Resp:      Temp:    98.8 F (37.1 C)  TempSrc:    Oral  SpO2:      Weight:      Height:        FHT: Category 1 at present, had single decel at 11:02 x 2 min.  Recovered with position change. UC:   regular, every 2-3 minutes SVE:   Dilation: 4 Effacement (%): 90 Station: -1, 0 Exam by:: Nigel BridgemanVicki Tashina Credit, cnm  at 0757 Pitocin at  MVUs 180-200 at present (since about 11a)  PCR 0.51  Assessment:  Induction for AMA, oligo GBS negative Pre-eclampsia without severe features  Plan: Consulted with Dr. Stefano GaulStringer. Magnesium sulfate Re-evaluate cervix when adequacy maintained  Britne Borelli CNM 10/19/2016, 11:32 AM

## 2016-10-19 NOTE — Transfer of Care (Signed)
Immediate Anesthesia Transfer of Care Note  Patient: Brenda Arellano  Procedure(s) Performed: Procedure(s): CESAREAN SECTION (N/A)  Patient Location: PACU  Anesthesia Type:Epidural  Level of Consciousness: awake, alert  and oriented  Airway & Oxygen Therapy: Patient Spontanous Breathing  Post-op Assessment: Report given to RN and Post -op Vital signs reviewed and stable  Post vital signs: Reviewed and stable  Last Vitals:  Vitals:   10/19/16 1346 10/19/16 1405  BP:  (!) 142/86  Pulse:  (!) 107  Resp:    Temp: 37.6 C   SpO2:      Last Pain:  Vitals:   10/19/16 1346  TempSrc: Oral  PainSc:          Complications: No apparent anesthesia complications

## 2016-10-19 NOTE — Anesthesia Postprocedure Evaluation (Signed)
Anesthesia Post Note  Patient: Brenda Arellano  Procedure(s) Performed: Procedure(s) (LRB): CESAREAN SECTION (N/A)     Patient location during evaluation: PACU Anesthesia Type: Epidural Level of consciousness: awake and alert Pain management: pain level controlled Vital Signs Assessment: post-procedure vital signs reviewed and stable Respiratory status: spontaneous breathing and respiratory function stable Cardiovascular status: blood pressure returned to baseline and stable Postop Assessment: epidural receding Anesthetic complications: no    Last Vitals:  Vitals:   10/19/16 1630 10/19/16 1635  BP: 129/82   Pulse: (!) 110 (!) 103  Resp: 19 18  Temp:    SpO2: 97% 97%    Last Pain:  Vitals:   10/19/16 1615  TempSrc:   PainSc: 0-No pain   Pain Goal:                 Beryle Lathehomas E Brock

## 2016-10-19 NOTE — Progress Notes (Signed)
Subjective: Called regarding pt having variable and late decels.  Pitocin turned off and position change with recovery of FHTs.  Pt comfortable with peanut ball on right side.  Objective: BP (!) 133/93   Pulse (!) 110   Temp 99 F (37.2 C) (Oral)   Resp 18   Ht 5\' 3"  (1.6 m)   Wt 98.4 kg (217 lb)   LMP 01/09/2016   SpO2 97%   BMI 38.44 kg/m  No intake/output data recorded. No intake/output data recorded.  FHT: Category 1 FHT 145 variability present with accels noted.  No decels. UC:   2 in 10 minutes SVE:   Deferred Pitocin off MVUs inadequate  Assessment:  Induction at term for AMA and oligo Cat 1 strip  Plan: Pitocin off for 30 minutes.  Restart at half.  If variable reoccur will consider amnioinfusion.    Henderson NewcomerNancy Jean Prothero CNM, MSN 10/19/2016, 5:59 AM

## 2016-10-19 NOTE — Progress Notes (Signed)
Subjective: Pt feeling some hip pain.  Denies rectal pressure.    Objective: BP 139/73   Pulse 91   Temp 98.7 F (37.1 C) (Oral)   Resp 18   Ht 5\' 3"  (1.6 m)   Wt 98.4 kg (217 lb)   LMP 01/09/2016   SpO2 97%   BMI 38.44 kg/m  No intake/output data recorded. No intake/output data recorded.  FHT: Category 1  FHT BL 140s occ variable.  Variability present UC:   regular, every 3 minutes SVE:   Dilation: 4 Effacement (%): 90 Station: -1 Exam by:: N Mileidy Atkin, CNM Pitocin at  MVUs 150  Assessment:  Induction for AMA Oligohydramnios GBS negative O negative BMI 38 Cat 1 strip  Plan: Increase pitocin to have adequate contractions.  Monitor progress  Henderson NewcomerNancy Jean Anselmo Reihl CNM, MSN 10/19/2016, 3:02 AM

## 2016-10-19 NOTE — Progress Notes (Signed)
The patient was interviewed today.  The previously documented history and physical examination was reviewed. The operative procedure was reviewed. The risks and benefits were outlined again. The specific risks include, but are not limited to, anesthetic complications, bleeding, infections, and possible damage to the surrounding organs. The patient's questions were answered.  We are ready to proceed as outlined. The likelihood of the patient achieving the goals of this procedure is very likely.   Leonard SchwartzArthur Vernon Camrie Stock, M.D. 10/19/16

## 2016-10-19 NOTE — Progress Notes (Signed)
Subjective: Pt breathing with contractions. Using fentanyl for pain.  Foley bulb tugged and out Objective: BP 139/73   Pulse 91   Temp 98.7 F (37.1 C) (Oral)   Resp 18   Ht 5\' 3"  (1.6 m)   Wt 98.4 kg (217 lb)   LMP 01/09/2016   SpO2 97%   BMI 38.44 kg/m  No intake/output data recorded. No intake/output data recorded.  FHT: Category 1 UC:   regular, every 2-4 minutes SVE:   3 ext 1 internal os  Adhesion broken on internal to complete 3/90/-1 AROM clear fluid  IUPC placed without difficulty Pitocin at 12 mu   Assessment:  Induction for AMA Oligohydramnios GBS negative O negative BMI 38 Cat 1 strip  Plan: Monitor progress, epidural when needed.  Henderson NewcomerNancy Jean Prothero CNM, MSN 10/19/2016, 3:09 AM

## 2016-10-19 NOTE — Progress Notes (Signed)
  Subjective: Comfortable with epidural.  Denies HA, visual sx, or epigastric pain.  Objective: BP 140/86   Pulse 73   Temp 99.4 F (37.4 C) (Oral)   Resp 17   Ht 5\' 3"  (1.6 m)   Wt 98.4 kg (217 lb)   LMP 01/09/2016   SpO2 97%   BMI 38.44 kg/m  No intake/output data recorded. No intake/output data recorded.   Vitals:   10/19/16 0610 10/19/16 0631 10/19/16 0701 10/19/16 0757  BP:  (!) 155/95 140/86   Pulse:  87 73   Resp:  17 17   Temp: 99.5 F (37.5 C)   99.4 F (37.4 C)  TempSrc: Oral   Oral  SpO2:      Weight:      Height:        FHT: Category 1--segments of decreased variability interspersed with  accels with scalp stim UC:   regular, every 2-4 minutes, some coupling/tripling SVE:   Dilation: 4 Effacement (%): 90 Station: -1, 0 Exam by:: Nigel BridgemanVicki Marlita Keil, cnm   Mildly edematous anterior lip Pitocin at 11 mu/min MVUs 95-180  Assessment:  Day 2 induction for AMA Oligo GBS negative New onset gestational hypertension  Plan: Check PCR Continue pitocin augmentation to establish/maintain adequacy. Close observation of maternal temp.   Nigel BridgemanLATHAM, Ramzey Petrovic CNM 10/19/2016, 8:07 AM

## 2016-10-19 NOTE — Progress Notes (Signed)
Results for Brenda Arellano, Brenda Arellano (MRN [id number]) as of 10/19/2016 09:33  Ref. Range 10/19/2016 08:28  Protein Creatinine Ratio Latest Ref Range: 0.00 - 0.15 mg/mgCre 0.51 (H)    Notified Nigel BridgemanVicki Latham, CNM of above result.  No new orders at this time.

## 2016-10-19 NOTE — Op Note (Signed)
OPERATIVE NOTE  Patient's Name: Brenda Arellano  Date of Birth: November 10, 1975   Medical Records Number: [id number]   Date of Operation: 10/19/2016   Preoperative diagnosis:  4631w4d weeks gestation  Preeclampsia  Failure to progress in labor  Fetal tachycardia  Meconium fluid  Postoperative diagnosis:  7031w4d weeks gestation  Preeclampsia  Failure to progress in labor  Fetal tachycardia  Meconium fluid  Poor respiratory effort by the infant  IUGR  Procedure:  Primary Low Transverse Cesarean Section  Surgeon:  Leonard SchwartzArthur Vernon Trueman Worlds, M.D.  Assistant:  Nigel BridgemanVicki Latham, certified nurse midwife  Anesthesia:  Epidural  Disposition:  Brenda Arellano is a 41 y.o. female, gravida 1 para 0 who presents at 6431w4d weeks gestation. The patient [facility]. The patient was admitted for induction of labor because of advanced maternal age. She was noted to have preeclampsia during this hospital admission. The patient did not dilate her cervix beyond 4 cm and began having swelling of the anterior lip of the cervix. A cesarean section was recommended and accepted. She understands the indications for her procedure and she accepts the risks of, but not limited to, anesthetic complications, bleeding, infections, and possible damage to the surrounding organs.  Findings:  A 5 pound 13 ounce female (Ashlynn) was delivered from a left occiput posterior position.  The Apgar scores were 1/5/7. The arterial cord blood pH is 7.189. The uterus, fallopian tubes, and ovaries were normal for the gravid state.  Procedure:  The patient was taken to the operating room where it was determined that the epidural she had her labor would be adequate for cesarean delivery. The patient's abdomen was prepped with Chloraprep.  A Foley catheter was previously placed in the bladder. The patient was  sterilely draped. The lower abdomen was injected with half percent Marcaine with epinephrine. A low transverse incision was made in the abdomen and carried sharply through the subcutaneous tissue, the fascia, and the anterior peritoneum. An incision was made in the lower uterine segment. The incision was extended in a low transverse fashion. The membranes were ruptured. The fetal head was delivered without difficulty. Upon delivery of the fetal head, the baby turned her head from side to side. The baby opened her eyes. Meconium was aspirated from the mouth and the nose prior to delivery of the body. The remainder of the infant was then delivered. The cord was clamped and cut. The infant was handed to the awaiting pediatric team. Cord blood studies were obtained including an arterial cord blood pH. The placenta was removed. The uterine cavity was cleaned of amniotic fluid, clotted blood, and membranes. The uterine incision was closed using a running locking suture of 2-0 Vicryl. An imbricating suture of 2-0 Vicryl was placed. The pelvis was vigorously irrigated. Hemostasis was adequate. The anterior peritoneum and the abdominal musculature were closed using 2-0 Vicryl. The fascia was closed using a running suture of 0 Vicryl followed by 3 interrupted sutures of 0 Vicryl. The subcutaneous layer was closed using interrupted sutures of 2-0 plain catgut suture. The skin was reapproximated using a subcuticular suture of 3-0 Monocryl. Sponge, needle, and instrument counts were correct on 2 occasions. The estimated blood loss for the procedure was approximately 700 cc. The patient tolerated her procedure well. She was transported to the recovery room in stable condition. The infant was taken to the neonatal intensive care nursery.The placenta was sent to pathology.  Leonard Schwartz, M.D. 10/19/2016

## 2016-10-20 LAB — COMPREHENSIVE METABOLIC PANEL
ALT: 14 U/L (ref 14–54)
AST: 29 U/L (ref 15–41)
Albumin: 2.4 g/dL — ABNORMAL LOW (ref 3.5–5.0)
Alkaline Phosphatase: 139 U/L — ABNORMAL HIGH (ref 38–126)
Anion gap: 7 (ref 5–15)
BUN: 7 mg/dL (ref 6–20)
CO2: 23 mmol/L (ref 22–32)
Calcium: 8.7 mg/dL — ABNORMAL LOW (ref 8.9–10.3)
Chloride: 102 mmol/L (ref 101–111)
Creatinine, Ser: 0.99 mg/dL (ref 0.44–1.00)
GFR calc Af Amer: >60 mL/min (ref >60)
GFR calc non Af Amer: >60 mL/min (ref >60)
Glucose, Bld: 131 mg/dL — ABNORMAL HIGH (ref 65–99)
Potassium: 4 mmol/L (ref 3.5–5.1)
Sodium: 132 mmol/L — ABNORMAL LOW (ref 135–145)
Total Bilirubin: 0.8 mg/dL (ref 0.3–1.2)
Total Protein: 6.3 g/dL — ABNORMAL LOW (ref 6.5–8.1)

## 2016-10-20 LAB — CBC
HCT: 33.3 % — ABNORMAL LOW (ref 36.0–46.0)
Hemoglobin: 11.6 g/dL — ABNORMAL LOW (ref 12.0–15.0)
MCH: 29.6 pg (ref 26.0–34.0)
MCHC: 34.8 g/dL (ref 30.0–36.0)
MCV: 84.9 fL (ref 78.0–100.0)
PLATELETS: 155 10*3/uL (ref 150–400)
RBC: 3.92 MIL/uL (ref 3.87–5.11)
RDW: 15.7 % — AB (ref 11.5–15.5)
WBC: 22.1 10*3/uL — AB (ref 4.0–10.5)

## 2016-10-20 LAB — MAGNESIUM
Magnesium: 6.7 mg/dL (ref 1.7–2.4)
Magnesium: 6.9 mg/dL (ref 1.7–2.4)

## 2016-10-20 NOTE — Lactation Note (Signed)
This note was copied from a baby's chart. Lactation Consultation Note  Patient Name: Brenda Cleon Dewara Koffler [id number]'[id number]'s Date: 10/20/2016 Reason for consult: Initial assessment;NICU baby  NICU baby 6620 hours old. Mom reports that she has been mainly hand expressing and not getting very much colostrum. Enc mom to pump every 2-3 hours for a total of 8-12 times/24 hours followed by hand expression. Mom reports that she has a [place]inoh DEBP. Discussed the benefits of hospital-grade pump and offered to send BF referral to WIC--but mom declined, stating that she will be in contact with WIC. [facility]. Mom given additional colostrum containers and has labels--so reviewed labeling instructions and EBM storage guidelines. [facility] given [facility] and [facility] booklet as well. Mom aware of OP/BFSG and LC phone line assistance after D/C.   Maternal Data Has patient been taught Hand Expression?: Yes (Per mom. )  Feeding    LATCH Score                   Interventions    Lactation Tools Discussed/Used Tools: Pump Breast pump type: Double-Electric Breast Pump WIC Program: Yes Pump Review: Setup, frequency, and cleaning;Milk Storage Initiated by:: bedside RN. Date initiated:: 10/19/16   Consult Status Consult Status: Follow-up Date: 10/21/16 Follow-up type: In-patient    Sherlyn HayJennifer D Moshe Wenger 10/20/2016, 11:15 AM

## 2016-10-20 NOTE — Progress Notes (Signed)
Post Partum Day 1 Cesarean Delivery  Subjective: no complaints  Objective: Blood pressure 135/78, pulse 83, temperature 98.5 F (36.9 C), temperature source Axillary, resp. rate 15, height 5\' 3"  (1.6 m), weight 98.4 kg (217 lb), last menstrual period 01/09/2016, SpO2 93 %, unknown if currently breastfeeding.  Tm 100.4 at 3p.  Physical Exam:  General: alert Lochia: appropriate Uterine Fundus: firm Incision: dressing c/d/i DVT Evaluation: No evidence of DVT seen on physical exam.   Recent Labs  10/19/16 1648 10/20/16 0555  HGB 10.9* 11.6*  HCT 31.7* 33.3*    Assessment/Plan: POD #1 with preeclampsia s/p Mg Doing well since mg d/c'd Good UOP Labs in the morning Temp increased, will observe for now and do fever w/u if recurs.  No obvious source of infection.   LOS: 3 days   Brenda Arellano Y 10/20/2016, 2:46 PM

## 2016-10-21 LAB — CBC WITH DIFFERENTIAL/PLATELET
BASOS PCT: 0 %
Basophils Absolute: 0 10*3/uL (ref 0.0–0.1)
Eosinophils Absolute: 0 10*3/uL (ref 0.0–0.7)
Eosinophils Relative: 0 %
HEMATOCRIT: 28.6 % — AB (ref 36.0–46.0)
Hemoglobin: 9.9 g/dL — ABNORMAL LOW (ref 12.0–15.0)
LYMPHS ABS: 1.9 10*3/uL (ref 0.7–4.0)
LYMPHS PCT: 12 %
MCH: 29.6 pg (ref 26.0–34.0)
MCHC: 34.6 g/dL (ref 30.0–36.0)
MCV: 85.4 fL (ref 78.0–100.0)
MONO ABS: 0.5 10*3/uL (ref 0.1–1.0)
MONOS PCT: 4 %
NEUTROS ABS: 13.2 10*3/uL — AB (ref 1.7–7.7)
NEUTROS PCT: 84 %
Platelets: 144 10*3/uL — ABNORMAL LOW (ref 150–400)
RBC: 3.35 MIL/uL — ABNORMAL LOW (ref 3.87–5.11)
RDW: 15.9 % — AB (ref 11.5–15.5)
WBC: 15.6 10*3/uL — ABNORMAL HIGH (ref 4.0–10.5)

## 2016-10-21 LAB — COMPREHENSIVE METABOLIC PANEL
ALBUMIN: 2 g/dL — AB (ref 3.5–5.0)
ALT: 11 U/L — ABNORMAL LOW (ref 14–54)
ANION GAP: 5 (ref 5–15)
AST: 18 U/L (ref 15–41)
Alkaline Phosphatase: 106 U/L (ref 38–126)
BUN: 8 mg/dL (ref 6–20)
CALCIUM: 8.6 mg/dL — AB (ref 8.9–10.3)
CO2: 26 mmol/L (ref 22–32)
Chloride: 102 mmol/L (ref 101–111)
Creatinine, Ser: 0.81 mg/dL (ref 0.44–1.00)
GFR calc Af Amer: >60 mL/min (ref >60)
GFR calc non Af Amer: >60 mL/min (ref >60)
GLUCOSE: 94 mg/dL (ref 65–99)
Potassium: 4.1 mmol/L (ref 3.5–5.1)
Sodium: 133 mmol/L — ABNORMAL LOW (ref 135–145)
TOTAL PROTEIN: 5.7 g/dL — AB (ref 6.5–8.1)
Total Bilirubin: 0.7 mg/dL (ref 0.3–1.2)

## 2016-10-21 MED ORDER — RHO D IMMUNE GLOBULIN 1500 UNIT/2ML IJ SOSY
300.0000 ug | PREFILLED_SYRINGE | Freq: Once | INTRAMUSCULAR | Status: AC
  Administered 2016-10-21: 300 ug via INTRAMUSCULAR
  Filled 2016-10-21: qty 2

## 2016-10-21 NOTE — Progress Notes (Signed)
Pt c/o gas pain. Encouraged to walk. Agreed to sit in rocking chair. Warm tea offered to sip. Pt became nauseated and vomited small amount of mucusy fluid. States she ate dinner last night but has not had an appetite today due to the discomfort. Bowel sound auscultated x 4 quadrants. Pt enc to avoid using straws and cold beverages. Encouraged to ambulate and to sip warm fluids if tolerated. Denies passing flatus at this point.

## 2016-10-21 NOTE — Progress Notes (Signed)
Pt still up in chair. Encouraged to ambulate. Assistance offered and declined. Pt states, "I'll walk later, before I go to bed."

## 2016-10-21 NOTE — Progress Notes (Signed)
Brenda Arellano [id number] Postpartum Day 2 S/P Primary Cesarean Section due to Failure to Progress during IOL and PreEclampsia  Subjective: Patient up ad lib, denies syncope or dizziness. Reports consuming regular diet without issues and denies N/V. Patient reports no bowel movement or passing of flatus.  Denies issues with urination and reports bleeding is "light."  Patient is pumping and feeding to NICU infant and reports minimal pumping in last 24 hours due to feelings of malaise that has since subsided.  Desires mirena for postpartum contraception.  Patient denies pain and states she has not taken any pain medication.   Objective: Temp:  [98.5 F (36.9 C)-100.4 F (38 C)] 99.8 F (37.7 C) (09/13 0830) Pulse Rate:  [53-94] 75 (09/13 0830) Resp:  [14-20] 20 (09/13 0830) BP: (135-148)/(78-88) 148/84 (09/13 0830) SpO2:  [97 %-100 %] 98 % (09/13 0830)   Recent Labs  10/19/16 1648 10/20/16 0555 10/21/16 0604  HGB 10.9* 11.6* 9.9*  HCT 31.7* 33.3* 28.6*  WBC 18.0* 22.1* 15.6*    Physical Exam:  General: alert, cooperative and no distress Mood/Affect: Appropriate/Bright Lungs: clear to auscultation, no wheezes, rales or rhonchi, symmetric air entry.  Heart: normal rate and regular rhythm. Breast: not examined. Abdomen:  + bowel sounds, Soft, Mild Tenderness and Distention Incision: CDI, Honeycomb dressing  Uterine Fundus: firm Lochia: appropriate Skin: Warm, Dry. DVT Evaluation: Calf/Ankle edema is present. JP drain:   None  Assessment Post Operative Day 2 S/P Primary C/S Normal Involution BreastFeeding Low Grade Fever  Plan: -Discussed ambulation and pain medication prior to -Lactation consult to assist with pumping and expressing milk as appropriate -CBC with Diff, CMP, and UC pending -Will order temperature  q 4 hours to assess for potential infection process -Plan for antibiotics, if temperature or infection process noted -Patient updated on POC and  verbalizes understanding -Consider discharge tomorrow -Continue other mgmt as ordered -Dr. ND given report and updated on patient status  Brenda RobinsJessica L Leathia Farnell MSN, CNM 10/21/2016, 07:51AM

## 2016-10-21 NOTE — Lactation Note (Signed)
This note was copied from a baby's chart. Lactation Consultation Note  Patient Name: Brenda Arellano   Visited with Mom, baby 5845 hrs old.  Mom resting in bed.  Stated she pumped yesterday.  Encouraged her to try to increase frequency today (goal of 8 times per 24 hrs).  Offered to assist her to double pump, but Mom declined as she wanted to sleep. Mom to call prn for assistance, and Lactation to follow up tomorrow.  Brenda Arellano, Brenda Arellano Arellano, 12:38 PM

## 2016-10-21 NOTE — Plan of Care (Signed)
Problem: Education: Goal: Knowledge of condition will improve Outcome: Progressing Reviewed with patient self care, diet and exercise adjustments and expected course for healing post c/section.

## 2016-10-22 LAB — CULTURE, OB URINE: Culture: NO GROWTH

## 2016-10-22 MED ORDER — SENNOSIDES-DOCUSATE SODIUM 8.6-50 MG PO TABS
2.0000 | ORAL_TABLET | ORAL | 2 refills | Status: DC
Start: 2016-10-23 — End: 2016-10-24

## 2016-10-22 MED ORDER — FERROUS SULFATE 325 (65 FE) MG PO TBEC: 325.0000 mg | DELAYED_RELEASE_TABLET | Freq: Two times a day (BID) | ORAL | 3 refills | Status: DC

## 2016-10-22 MED ORDER — IBUPROFEN 600 MG PO TABS: 600.0000 mg | ORAL_TABLET | Freq: Four times a day (QID) | ORAL | 2 refills | Status: AC | PRN

## 2016-10-22 NOTE — Clinical Social Work Maternal (Signed)
  CLINICAL SOCIAL WORK MATERNAL/CHILD NOTE  Patient Details  Name: Brenda Arellano MRN: [id number] Date of Birth: 10/30/1975  Date:  10/22/2016  Clinical Social Worker Initiating Note:  Laurey Arrow Date/ Time Initiated:  10/22/16/1256     Child's Name:  Brenda Arellano   Legal Guardian:  Mother (FOB is Brenda Arellano 10/29/78.)   Need for Interpreter:  None   Date of Referral:  10/22/16     Reason for Referral:  Other (Comment) (NICU admission)   Referral Source:  NICU   Address:  [address] [place] [address]  Phone number:  [phone]   Household Members:  Self   Natural Supports (not living in the home):  Parent, Immediate Family, Friends, Spouse/significant other   Professional Supports: None   Employment: Unemployed   Type of Work:     Education:      Pensions consultant:  [employer]   Other Resources:  [employer]   Cultural/Religious Considerations Which May Impact Care:  Per MOB, MOB's religion is Non-Denominational  Strengths:  Ability to meet basic needs , Home prepared for child    Risk Factors/Current Problems:  Mental Health Concerns    Cognitive State:  Able to Concentrate , Alert , Linear Thinking , Insightful , Goal Oriented    Mood/Affect:  Bright , Tearful , Happy , Calm , Interested , Comfortable    CSW Assessment: CSW met with MOB to complete an assessment for infant's NICU admission. MOB was inviting, polite, and receptive to meeting with CSW. MOB denied any MH and SA hx.  CSW inquired about barriers, needs, and concerns and MOB denied them.  MOB communicated that MOB has everything she needs for infant. MOB became tearful as MOB spoke about discharging from the hospital and having to leave the infant in the NICU.  CSW normalized and validated MOB's thoughts and feelings.  CSW reviewed NICU visitation policy and encouraged MOB to visit as often as she likes.  CSW provided education regarding [name] vs PMADs.  CSW also  encouraged MOB to evaluate her mental health throughout the postpartum period with the use of the New Mom Checklist developed by Postpartum Progress and notify a medical professional if symptoms arise.  MOB denied SI, HI, and DV.  MOB did not present with any acute MH symptoms and appeared to have insight and awareness.  MOB also assured CSW that MOB feels comfortable seeking help if help is warranted.   MOB assisted MOB with connecting to [facility] and explained to MOB the process of adding infant on to [facility] Medicaid application.   CSW will continue to assess for psychosocial stressors while infant remains in NICU.  Please contact the Clinical Social Worker if specific needs arise, or requested from MOB.  CSW Plan/Description:  Information/Referral to [employer] , Psychosocial Support and Ongoing Assessment of Needs, Patient/Family Education    Laurey Arrow, MSW, [employer] Clinical Social Work [phone]   Dimple Nanas, LCSW 10/22/2016, 3:59 PM

## 2016-10-22 NOTE — Progress Notes (Signed)
I received a spiritual care consult for this patient.  Lactation consultant reported that pt was tearful during their time together.  I offered Cassia space to share her feelings about being separated from her baby and about her unexpected birthing process that ended in a C/Section and a NICU admission for her baby.  I relayed to her that anytime there is something unexpected in a birth, there is more of a risk for PAMD.  She seemed well aware of the possibility and has a friend who experienced significant PPD.  She is aware of the symptoms and will be on the look out for them.  She has good family support and is hopeful that her baby will soon be home with her.  We focused on the things that she is able to do for her baby at this time and that helped her feel more empowered.  I gave her my card for follow up support and let her know that if she continued to need to process her birth experience that she could also call and we could help her find the support she needs.  Chaplain Dyanne Carrel, Bcc Pager, [phone] 2:08 PM    10/22/16 1400  Clinical Encounter Type  Visited With Patient  Visit Type Spiritual support  Referral From Nurse

## 2016-10-22 NOTE — Progress Notes (Signed)
Pt given discharge paperwork and went over instructions. Prescriptions reviewed. Discussed home care and follow up appointments. Pt verbalizes understanding.

## 2016-10-22 NOTE — Lactation Note (Signed)
This note was copied from a baby's chart. Lactation Consultation Note  Patient Name: Girl Davita Sublett LOVFI'E Date: 10/22/2016 Reason for consult: Follow-up assessment   With this mom of a NICU baby, now 41 weeks CGA, 66 hours post partum,  and SGA at 5 lbs 15.8 oz. Mom was pumping, but had stopped. She talked about being overwhelmed, and how thing s did not go as planned with her delivery. I agreed with mom that her delivery must have been very scary, and she had reason to be sad. I then told her I did not want her to be sorry in a week or two that she did not pump, and loses her milk . Mom agreed to me examining her breasts, and with hand expression, I showed mom that her milk was transitioning in. She was then willing to pump. II explained the maintenance setting to mom, and told her about the importacne of pumping for the first two weeks after delivery. Mom does have a DEP at home. I reviewed frequency and duration of pumping with mom.  I also advised mom to speak with her OB about her sad feeling, and let her nurse, Caitlyn know also.  I also encouraged mom to do skin to skin with her baby in the NICU. Mom said she already lost her opportunity to do that , since "thye took her from me" at birth. I explained that skin to skin is as much for her bay as it is for  her, and how healing it is for both of them. Mom seemed to be a little more positive after talking.  I told mom to have her nurse call for me if she has further questions/concerns.    Maternal Data    Feeding Feeding Type: Formula Nipple Type: Slow - flow Length of feed: 10 min  LATCH Score                   Interventions    Lactation Tools Discussed/Used     Consult Status Consult Status: Complete Follow-up type: Call as needed    Alfred Levins 10/22/2016, 9:23 AM

## 2016-10-22 NOTE — Discharge Instructions (Signed)
Cesarean Delivery Cesarean birth, or cesarean delivery, is the surgical delivery of a baby through an incision in the abdomen and the uterus. This may be referred to as a C-section. This procedure may be scheduled ahead of time, or it may be done in an emergency situation. Tell a health care provider about:  Any allergies you have.  All medicines you are taking, including vitamins, herbs, eye drops, creams, and over-the-counter medicines.  Any problems you or family members have had with anesthetic medicines.  Any blood disorders you have.  Any surgeries you have had.  Any medical conditions you have.  Whether you or any members of your family have a history of deep vein thrombosis (DVT) or pulmonary embolism (PE). What are the risks? Generally, this is a safe procedure. However, problems may occur, including:  Infection.  Bleeding.  Allergic reactions to medicines.  Damage to other structures or organs.  Blood clots.  Injury to your baby.  What happens before the procedure?  Follow instructions from your health care provider about eating or drinking restrictions.  Follow instructions from your health care provider about bathing before your procedure to help reduce your risk of infection.  If you know that you are going to have a cesarean delivery, do not shave your pubic area. Shaving before the procedure may increase your risk of infection.  Ask your health care provider about: ? Changing or stopping your regular medicines. This is especially important if you are taking diabetes medicines or blood thinners. ? Your pain management plan. This is especially important if you plan to breastfeed your baby. ? How long you will be in the hospital after the procedure. ? Any concerns you may have about receiving blood products if you need them during the procedure. ? Cord blood banking, if you plan to collect your babys umbilical cord blood.  You may also want to ask your  health care provider: ? Whether you will be able to hold or breastfeed your baby while you are still in the operating room. ? Whether your baby can stay with you immediately after the procedure and during your recovery. ? Whether a family member or a person of your choice can go with you into the operating room and stay with you during the procedure, immediately after the procedure, and during your recovery.  Plan to have someone drive you home when you are discharged from the hospital. What happens during the procedure?  Fetal monitors will be placed on your abdomen to monitor your heart rate and your baby's heart rate.  Depending on the reason for your cesarean delivery, you may have a physical exam or additional testing, such as an ultrasound.  An IV tube will be inserted into one of your veins.  You may have your blood or urine tested.  You will be given antibiotic medicine to help prevent infection.  You may be given a special warming gown to wear to keep your temperature stable.  Hair may be removed from your pubic area.  The skin of your pubic area and lower abdomen will be cleaned with a germ-killing solution (antiseptic).  A catheter may be inserted into your bladder through your urethra. This drains your urine during the procedure.  You may be given one or more of the following: ? A medicine to numb the area (local anesthetic). ? A medicine to make you fall asleep (general anesthetic). ? A medicine (regional anesthetic) that is injected into your back or through a small  thin tube placed in your back (spinal anesthetic or epidural anesthetic). This numbs everything below the injection site and allows you to stay awake during your procedure. If this makes you feel nauseous, tell your health care provider. Medicines will be available to help reduce any nausea you may feel.  An incision will be made in your abdomen, and then in your uterus.  If you are awake during your  procedure, you may feel tugging and pulling in your abdomen, but you should not feel pain. If you feel pain, tell your health care provider immediately.  Your baby will be removed from your uterus. You may feel more pressure or pushing while this happens.  Immediately after birth, your baby will be dried and kept warm. You may be able to hold and breastfeed your baby. The umbilical cord may be clamped and cut during this time.  Your placenta will be removed from your uterus.  Your incisions will be closed with stitches (sutures). Staples, skin glue, or adhesive strips may also be applied to the incision in your abdomen.  Bandages (dressings) will be placed over the incision in your abdomen. The procedure may vary among health care providers and hospitals. What happens after the procedure?  Your blood pressure, heart rate, breathing rate, and blood oxygen level will be monitored often until the medicines you were given have worn off.  You may continue to receive fluids and medicines through an IV tube.  You will have some pain. Medicines will be available to help control your pain.  To help prevent blood clots: ? You may be given medicines. ? You may have to wear compression stockings or devices. ? You will be encouraged to walk around when you are able.  Hospital staff will encourage and support bonding with your baby. Your hospital may allow you and your baby to stay in the same room (rooming in) during your hospital stay to encourage successful breastfeeding.  You may be encouraged to cough and breathe deeply often. This helps to prevent lung problems.  If you have a catheter draining your urine, it will be removed as soon as possible after your procedure. This information is not intended to replace advice given to you by your health care provider. Make sure you discuss any questions you have with your health care provider. Document Released: 01/25/2005 Document Revised: 07/03/2015  Document Reviewed: 11/05/2014 Elsevier Interactive Patient Education  2017 [employer] Inc[employer]  Iron-Rich Diet Iron is a mineral that helps your body to produce hemoglobin. Hemoglobin is a protein in your red blood cells that carries oxygen to your body's tissues. Eating too little iron may cause you to feel weak and tired, and it can increase your risk for infection. Eating enough iron is necessary for your body's metabolism, muscle function, and nervous system. Iron is naturally found in many foods. It can also be added to foods or fortified in foods. There are two types of dietary iron:  Heme iron. Heme iron is absorbed by the body more easily than nonheme iron. Heme iron is found in meat, poultry, and fish.  Nonheme iron. Nonheme iron is found in dietary supplements, iron-fortified grains, beans, and vegetables.  You may need to follow an iron-rich diet if:  You have been diagnosed with iron deficiency or iron-deficiency anemia.  You have a condition that prevents you from absorbing dietary iron, such as: ? Infection in your intestines. ? Celiac disease. This involves long-lasting (chronic) inflammation of your intestines.  You do not  eat enough iron.  You eat a diet that is high in foods that impair iron absorption.  You have lost a lot of blood.  You have heavy bleeding during your menstrual cycle.  You are pregnant.  What is my plan? Your health care provider may help you to determine how much iron you need per day based on your condition. Generally, when a person consumes sufficient amounts of iron in the diet, the following iron needs are met:  Men. ? 73-26 years old: 11 mg per day. ? 89-22 years old: 8 mg per day.  Women. ? 32-61 years old: 15 mg per day. ? 83-79 years old: 18 mg per day. ? Over 39 years old: 8 mg per day. ? Pregnant women: 27 mg per day. ? Breastfeeding women: 9 mg per day.  What do I need to know about an iron-rich diet?  Eat fresh fruits and  vegetables that are high in vitamin C along with foods that are high in iron. This will help increase the amount of iron that your body absorbs from food, especially with foods containing nonheme iron. Foods that are high in vitamin C include oranges, peppers, tomatoes, and mango.  Take iron supplements only as directed by your health care provider. Overdose of iron can be life-threatening. If you were prescribed iron supplements, take them with orange juice or a vitamin C supplement.  Cook foods in pots and pans that are made from iron.  Eat nonheme iron-containing foods alongside foods that are high in heme iron. This helps to improve your iron absorption.  Certain foods and drinks contain compounds that impair iron absorption. Avoid eating these foods in the same meal as iron-rich foods or with iron supplements. These include: ? Coffee, black tea, and red wine. ? Milk, dairy products, and foods that are high in calcium. ? Beans, soybeans, and peas. ? Whole grains.  When eating foods that contain both nonheme iron and compounds that impair iron absorption, follow these tips to absorb iron better. ? Soak beans overnight before cooking. ? Soak whole grains overnight and drain them before using. ? Ferment flours before baking, such as using yeast in bread dough. What foods can I eat? Grains Iron-fortified breakfast cereal. Iron-fortified whole-wheat bread. Enriched rice. Sprouted grains. Vegetables Spinach. Potatoes with skin. Green peas. Broccoli. Red and green bell peppers. Fermented vegetables. Fruits Prunes. Raisins. Oranges. Strawberries. Mango. Grapefruit. Meats and Other Protein Sources Beef liver. Oysters. Beef. Shrimp. [place]. Chicken. [place]. Sardines. Chickpeas. Nuts. Tofu. Beverages Tomato juice. Fresh orange juice. Prune juice. Hibiscus tea. Fortified instant breakfast shakes. Condiments Tahini. Fermented soy sauce. Sweets and Desserts Black-strap molasses. Other Wheat  germ. The items listed above may not be a complete list of recommended foods or beverages. Contact your dietitian for more options. What foods are not recommended? Grains Whole grains. Bran cereal. Bran flour. Oats. Vegetables Artichokes. Brussels sprouts. Kale. Fruits Blueberries. Raspberries. Strawberries. Figs. Meats and Other Protein Sources Soybeans. Products made from soy protein. Dairy Milk. Cream. Cheese. Yogurt. Cottage cheese. Beverages Coffee. Black tea. Red wine. Sweets and Desserts Cocoa. Chocolate. Ice cream. Other Basil. Oregano. Parsley. The items listed above may not be a complete list of foods and beverages to avoid. Contact your dietitian for more information. This information is not intended to replace advice given to you by your health care provider. Make sure you discuss any questions you have with your health care provider. Document Released: 09/08/2004 Document Revised: 08/15/2015 Document Reviewed: 08/22/2013 Elsevier Interactive Patient  Education  2018 [employer].

## 2016-10-22 NOTE — Discharge Summary (Signed)
OB Discharge Summary     Patient Name: Brenda Arellano DOB: May 13, 1975 MRN: [id number]  Date of admission: 10/17/2016 Delivering MD: Kirkland Hun   Date of discharge: 10/22/2016  Admitting diagnosis: 41.2 WKS, INDUCTION Intrauterine pregnancy: [redacted]w[redacted]d     Secondary diagnosis:  Principal Problem:   Oligohydramnios in third trimester Active Problems:   Indication for care or intervention in labor or delivery   Preeclampsia  Additional problems: Advanced Maternal Age, S/P Primary Cesarean Section     Discharge diagnosis: Preterm Pregnancy Delivered, Preeclampsia (severe), Anemia and S/P Primary C/S                                                                                                Post partum procedures:MgSO4 Infusion  Augmentation: None  Complications: None  Hospital course:  Induction of Labor With Cesarean Section  41 y.o. yo G1P1000 at [redacted]w[redacted]d was admitted to the hospital 10/17/2016 for induction of labor. Patient had a labor course significant for cytotec, pitocin, and foley bulb placement. The patient went for cesarean section due to failure to progress and PreEclampsia, and delivered a Viable infant,@BABYSUPPRESS (DBLINK,ept,110,,1,,) Membrane Rupture Time/Date: )9:08 PM ,10/18/2016    of operation can be found in separate operative Note.  Patient had an uncomplicated postpartum course. She is ambulating, tolerating a regular diet, passing flatus, and urinating well.  Patient is discharged home in stable condition on 10/22/16.                                    Physical exam  Vitals:   10/21/16 1600 10/21/16 1940 10/22/16 0011 10/22/16 0542  BP: (!) 150/83 (!) 152/80 (!) 142/94 (!) 147/87  Pulse: 87 62 71   Resp: Temp: 99.3 F (37.4 C) 99 F (37.2 C) 98.4 F (36.9 C) 98.4 F (36.9 C)  TempSrc: Oral Axillary Oral Axillary  SpO2: 99% 100% 100%   Weight:      Height:       General: alert, cooperative and no distress  Mood  Appropriate Chest: HRRR, Lungs CTA Abd: BS x 4, Mild Distention, Appropriately Tender, Mild rash noted r/t pressure dressing Lochia: appropriate Uterine Fundus: firm/U-1 Incision: Dressing is clean, dry, and intact DVT Evaluation: Calf/Ankle edema is present Labs: Lab Results  Component Value Date   WBC 15.6 (H) 10/21/2016   HGB 9.9 (L) 10/21/2016   HCT 28.6 (L) 10/21/2016   MCV 85.4 10/21/2016   PLT 144 (L) 10/21/2016   CMP Latest Ref Rng & Units 10/21/2016  Glucose 65 - 99 mg/dL 94  BUN 6 - 20 mg/dL 8  Creatinine 4.09 - 8.11 mg/dL 9.14  Sodium 782 - 956 mmol/L 133(L)  Potassium 3.5 - 5.1 mmol/L 4.1  Chloride 101 - 111 mmol/L 102  CO2 22 - 32 mmol/L 26  Calcium 8.9 - 10.3 mg/dL 2.1(H)  Total Protein 6.5 - 8.1 g/dL 0.8(M)  Total Bilirubin 0.3 - 1.2 mg/dL 0.7  Alkaline Phos 38 - 126 U/L 106  AST 15 - 41  U/L 18  ALT 14 - 54 U/L 11(L)    Discharge instruction: per After Visit Summary and "Baby and Me Booklet". Pain Management, Peri-Care, Breastfeeding, Who and When to call for postpartum complications. Information Sheet(s) given Iron Rich Diet, C/S Care. Remove steri-strips in 7-10 days.  Incision care guidelines: how to clean, when to call, and anticipated healing.    After visit meds:  Allergies as of 10/22/2016   No Known Allergies     Medication List    TAKE these medications   ferrous sulfate 325 (65 FE) MG EC tablet Take 1 tablet (325 mg total) by mouth 2 (two) times daily. For 14 days, then once daily for 28 days.   ibuprofen 600 MG tablet Commonly known as:  ADVIL,MOTRIN Take 1 tablet (600 mg total) by mouth every 6 (six) hours as needed.   prenatal multivitamin Tabs tablet Take 1 tablet by mouth daily at 12 noon.   senna-docusate 8.6-50 MG tablet Commonly known as:  Senokot-S Take 2 tablets by mouth daily. For 7 days.   VALTREX 500 MG tablet Generic drug:  valACYclovir Take 500 mg by mouth daily.            Discharge Care Instructions         Start     Ordered   10/23/16 0000  senna-docusate (SENOKOT-S) 8.6-50 MG tablet  Every 24 hours     10/22/16 0624   10/22/16 0000  ibuprofen (ADVIL,MOTRIN) 600 MG tablet  Every 6 hours PRN     10/22/16 0624   10/22/16 0000  Diet - low sodium heart healthy     10/22/16 0624   10/22/16 0000  ferrous sulfate 325 (65 FE) MG EC tablet  2 times daily     10/22/16 0624   10/22/16 0000  Discharge instructions    Comments:  [facility] Handbook Activity Restrictions Who and when to call Follow Up Appointment   10/22/16 0624   10/22/16 0000  Call MD for:  temperature >100.4     10/22/16 0624   10/22/16 0000  Call MD for:  severe uncontrolled pain     10/22/16 0624   10/22/16 0000  Call MD for:  redness, tenderness, or signs of infection (pain, swelling, redness, odor or green/yellow discharge around incision site)     10/22/16 0624   10/22/16 0000  Call MD for:  difficulty breathing, headache or visual disturbances     10/22/16 0624   10/22/16 0000  Call MD for:  persistant dizziness or light-headedness     10/22/16 0624   10/22/16 0000  Lifting restrictions    Comments:  Weight restriction of 10 lbs.   10/22/16 0624   10/22/16 0000  Driving restriction     Comments:  Avoid driving if on narcotics and unable to move without pain.   10/22/16 0624   10/22/16 0000  Sexual acrtivity    Comments:  No sexual intercourse for 6 weeks No tampons, douche, or objects in vagina for 6 weeks   10/22/16 0624      Diet: routine diet  Activity: Advance as tolerated. Pelvic rest for 6 weeks.   Outpatient follow up:6 weeks Follow up Appt:No future appointments. Follow up Visit:No Follow-up on file.  Postpartum contraception: IUD Mirena  Newborn Data: Live born female  Birth Weight: 5 lb 12.8 oz (2630 g) APGAR: 1, 5  Baby Feeding: Bottle and Breast Disposition:NICU   10/22/2016 Cherre Robins, CNM

## 2016-10-23 LAB — RH IG WORKUP (INCLUDES ABO/RH)
ABO/RH(D): O NEG
Fetal Screen: NEGATIVE
GESTATIONAL AGE(WKS): 40
Unit division: 0

## 2016-10-24 ENCOUNTER — Inpatient Hospital Stay (HOSPITAL_COMMUNITY)
Admission: AD | Admit: 2016-10-24 | Discharge: 2016-10-24 | Disposition: A | Discharge status: Home / Self Care | Payer: Medicaid Other | Source: Ambulatory Visit | Attending: Obstetrics and Gynecology | Admitting: Obstetrics and Gynecology

## 2016-10-24 ENCOUNTER — Encounter (HOSPITAL_COMMUNITY): Payer: Self-pay

## 2016-10-24 DIAGNOSIS — R112 Nausea with vomiting, unspecified: Secondary | ICD-10-CM | POA: Diagnosis present

## 2016-10-24 DIAGNOSIS — O9089 Other complications of the puerperium, not elsewhere classified: Secondary | ICD-10-CM | POA: Diagnosis not present

## 2016-10-24 HISTORY — DX: Essential (primary) hypertension: I10

## 2016-10-24 HISTORY — DX: Gestational (pregnancy-induced) hypertension without significant proteinuria, unspecified trimester: O13.9

## 2016-10-24 LAB — COMPREHENSIVE METABOLIC PANEL
ALBUMIN: 2.4 g/dL — AB (ref 3.5–5.0)
ALT: 14 U/L (ref 14–54)
ANION GAP: 13 (ref 5–15)
AST: 18 U/L (ref 15–41)
Alkaline Phosphatase: 103 U/L (ref 38–126)
BILIRUBIN TOTAL: 1 mg/dL (ref 0.3–1.2)
BUN: 9 mg/dL (ref 6–20)
CO2: 24 mmol/L (ref 22–32)
Calcium: 9.3 mg/dL (ref 8.9–10.3)
Chloride: 101 mmol/L (ref 101–111)
Creatinine, Ser: 0.67 mg/dL (ref 0.44–1.00)
GFR calc Af Amer: >60 mL/min (ref >60)
GFR calc non Af Amer: >60 mL/min (ref >60)
GLUCOSE: 93 mg/dL (ref 65–99)
POTASSIUM: 4.1 mmol/L (ref 3.5–5.1)
SODIUM: 138 mmol/L (ref 135–145)
TOTAL PROTEIN: 6.4 g/dL — AB (ref 6.5–8.1)

## 2016-10-24 LAB — URINALYSIS, ROUTINE W REFLEX MICROSCOPIC
BILIRUBIN URINE: NEGATIVE
GLUCOSE, UA: NEGATIVE mg/dL
KETONES UR: 20 mg/dL — AB
NITRITE: NEGATIVE
PH: 6 (ref 5.0–8.0)
Protein, ur: 30 mg/dL — AB
Specific Gravity, Urine: 1.023 (ref 1.005–1.030)

## 2016-10-24 LAB — CBC WITH DIFFERENTIAL/PLATELET
BASOS PCT: 0 %
Basophils Absolute: 0 10*3/uL (ref 0.0–0.1)
EOS ABS: 0 10*3/uL (ref 0.0–0.7)
Eosinophils Relative: 0 %
HEMATOCRIT: 30.5 % — AB (ref 36.0–46.0)
HEMOGLOBIN: 10.3 g/dL — AB (ref 12.0–15.0)
Lymphocytes Relative: 14 %
Lymphs Abs: 1.6 10*3/uL (ref 0.7–4.0)
MCH: 28.7 pg (ref 26.0–34.0)
MCHC: 33.8 g/dL (ref 30.0–36.0)
MCV: 85 fL (ref 78.0–100.0)
Monocytes Absolute: 1 10*3/uL (ref 0.1–1.0)
Monocytes Relative: 9 %
NEUTROS ABS: 8.7 10*3/uL — AB (ref 1.7–7.7)
NEUTROS PCT: 77 %
Platelets: 206 10*3/uL (ref 150–400)
RBC: 3.59 MIL/uL — ABNORMAL LOW (ref 3.87–5.11)
RDW: 14.9 % (ref 11.5–15.5)
WBC: 11.3 10*3/uL — ABNORMAL HIGH (ref 4.0–10.5)

## 2016-10-24 MED ORDER — ONDANSETRON 8 MG PO TBDP
8.0000 mg | ORAL_TABLET | Freq: Once | ORAL | Status: AC
  Administered 2016-10-24: 8 mg via ORAL
  Filled 2016-10-24: qty 1

## 2016-10-24 MED ORDER — DEXTROSE 5 % IN LACTATED RINGERS IV BOLUS
500.0000 mL | Freq: Once | INTRAVENOUS | Status: AC
  Administered 2016-10-24: 04:00:00 via INTRAVENOUS

## 2016-10-24 MED ORDER — ONDANSETRON HCL 4 MG PO TABS: 4.0000 mg | ORAL_TABLET | Freq: Every day | ORAL | 1 refills | Status: AC | PRN

## 2016-10-24 NOTE — MAU Note (Signed)
Urine in lab 

## 2016-10-24 NOTE — MAU Note (Addendum)
Had c/s on 9/11. Went home Friday and throwing up all day Sat. Nothing is staying down. Emesis is now green. Watery BMs since Friday before d/c home from hospital

## 2016-10-24 NOTE — Discharge Instructions (Signed)
Nausea and Vomiting, Adult Feeling sick to your stomach (nausea) means that your stomach is upset or you feel like you have to throw up (vomit). Feeling more and more sick to your stomach can lead to throwing up. Throwing up happens when food and liquid from your stomach are thrown up and out the mouth. Throwing up can make you feel weak and cause you to get dehydrated. Dehydration can make you tired and thirsty, make you have a dry mouth, and make it so you pee (urinate) less often. Older adults and people with other diseases or a weak defense system (immune system) are at higher risk for dehydration. If you feel sick to your stomach or if you throw up, it is important to follow instructions from your doctor about how to take care of yourself. Follow these instructions at home: Eating and drinking Follow these instructions as told by your doctor:  Take an oral rehydration solution (ORS). This is a drink that is sold at pharmacies and stores.  Drink clear fluids in small amounts as you are able, such as: ? Water. ? Ice chips. ? Diluted fruit juice. ? Low-calorie sports drinks.  Eat bland, easy-to-digest foods in small amounts as you are able, such as: ? Bananas. ? Applesauce. ? Rice. ? Low-fat (lean) meats. ? Toast. ? Crackers.  Avoid fluids that have a lot of sugar or caffeine in them.  Avoid alcohol.  Avoid spicy or fatty foods.  General instructions  Drink enough fluid to keep your pee (urine) clear or pale yellow.  Wash your hands often. If you cannot use soap and water, use hand sanitizer.  Make sure that all people in your home wash their hands well and often.  Take over-the-counter and prescription medicines only as told by your doctor.  Rest at home while you get better.  Watch your condition for any changes.  Breathe slowly and deeply when you feel sick to your stomach.  Keep all follow-up visits as told by your doctor. This is important. Contact a doctor  if:  You have a fever.  You cannot keep fluids down.  Your symptoms get worse.  You have new symptoms.  You feel sick to your stomach for more than two days.  You feel light-headed or dizzy.  You have a headache.  You have muscle cramps. Get help right away if:  You have pain in your chest, neck, arm, or jaw.  You feel very weak or you pass out (faint).  You throw up again and again.  You see blood in your throw-up.  Your throw-up looks like black coffee grounds.  You have bloody or black poop (stools) or poop that look like tar.  You have a very bad headache, a stiff neck, or both.  You have a rash.  You have very bad pain, cramping, or bloating in your belly (abdomen).  You have trouble breathing.  You are breathing very quickly.  Your heart is beating very quickly.  Your skin feels cold and clammy.  You feel confused.  You have pain when you pee.  You have signs of dehydration, such as: ? Dark pee, hardly any pee, or no pee. ? Cracked lips. ? Dry mouth. ? Sunken eyes. ? Sleepiness. ? Weakness. These symptoms may be an emergency. Do not wait to see if the symptoms will go away. Get medical help right away. Call your local emergency services (911 in the [place] Do not drive yourself to the hospital. This information is   not intended to replace advice given to you by your health care provider. Make sure you discuss any questions you have with your health care provider. Document Released: 07/14/2007 Document Revised: 08/15/2015 Document Reviewed: 10/01/2014 Elsevier Interactive Patient Education  2018 Elsevier Inc.  

## 2016-10-24 NOTE — Progress Notes (Signed)
pthad 2 saltine crackers and another cup of water with no n/v.

## 2016-10-24 NOTE — Progress Notes (Signed)
Written and verbal d/c instructions given and understanding voiced. 

## 2016-10-24 NOTE — MAU Note (Signed)
Chief Complaint: Emesis   First Provider Initiated Contact with Patient 10/24/16 0244     SUBJECTIVE HPI: Brenda Arellano is a 41 y.o. G1P1000 at [redacted]w[redacted]d who presents to Maternity Admissions reporting nausea and vomiting.  Pt is post primary LTCS on 10/19/2016.  PT delivery was complicated by Pre eclampsia treated with Magnesium Sulfate.  Pt was discharged on 10/22/2016 in stable condition. Pt states since than has had no appetite.  Pt infant in NICU due to low weight.  Pt is pumping breast milk but not consistent in pumping.  Has not been to NICU since discharge.  Pt denies chills, body aches or pain.  Denies fever.  Pt states having loose stools.     Quality: small amounts of greenish emesis Severity: 0/10 on pain scale Duration: since Friday Timing: all day Modifying factors:has not taken anything   Past Medical History:  Diagnosis Date  . Abnormal Pap smear   . HSV infection   . Hypertension   . 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 Pregnancy induced hypertension   . Vaginal Pap smear, abnormal    OB History  Gravida Para Term Preterm AB Living  SAB TAB Ectopic Multiple Live Births        0      # Outcome Date GA Lbr Len/2nd Weight Sex Delivery Anes PTL Lv  1 Term 10/19/16 [redacted]w[redacted]d  2.63 kg (5 lb 12.8 oz) F CS-LTranv EPI       Past Surgical History:  Procedure Laterality Date  . CESAREAN SECTION N/A 10/19/2016   Procedure: CESAREAN SECTION;  Surgeon: Kirkland Hun, MD;  Location: [facility] BIRTHING SUITES;  Service: Obstetrics;  Laterality: N/A;  . CRYOTHERAPY    . Right hypoplastic lung  1987   Social History   Social History  . Marital status: Single    Spouse name: N/A  . Number of children: N/A  . Years of education: N/A   Occupational History  . Not on file.   Social History Main Topics  . Smoking status: Never Smoker  . Smokeless tobacco: Never Used  . Alcohol use Yes     Comment: occasionally  . Drug use: No  .  Sexual activity: Yes     Comment: Loryna   Other Topics Concern  . Not on file   Social History Narrative  . No narrative on file   Family History  Problem Relation Age of Onset  . Kidney disease Father   . Arthritis Mother   . Heart disease Maternal Grandmother   . Diabetes Maternal Grandmother   . Stroke Maternal Grandmother   . Thyroid disease Maternal Grandmother    No current facility-administered medications on file prior to encounter.    Current Outpatient Prescriptions on File Prior to Encounter  Medication Sig Dispense Refill  . ferrous sulfate 325 (65 FE) MG EC tablet Take 1 tablet (325 mg total) by mouth 2 (two) times daily. For 14 days, then once daily for 28 days. 60 tablet 3  . ibuprofen (ADVIL,MOTRIN) 600 MG tablet Take 1 tablet (600 mg total) by mouth every 6 (six) hours as needed. 30 tablet 2  . Prenatal Vit-Fe Fumarate-FA (PRENATAL MULTIVITAMIN) TABS tablet Take 1 tablet by mouth daily at 12 noon.    . senna-docusate (SENOKOT-S) 8.6-50 MG tablet Take 2 tablets by mouth daily. For 7 days. 14 tablet 2  . valACYclovir (VALTREX)  500 MG tablet Take 500 mg by mouth daily.     No Known Allergies  I have reviewed patient's Past Medical Hx, Surgical Hx, Family Hx, Social Hx, medications and allergies.   Review of Systems  Constitutional: Positive for appetite change and fatigue.  HENT: Negative.   Eyes: Negative.   Respiratory: Negative.   Cardiovascular: Negative.   Gastrointestinal: Positive for diarrhea and vomiting.  Endocrine: Negative.   Genitourinary: Negative.   Musculoskeletal: Negative.   Skin: Negative.   Allergic/Immunologic: Negative.   Neurological: Negative.   Hematological: Negative.   Psychiatric/Behavioral: Negative.     OBJECTIVE Patient Vitals for the past 24 hrs:  BP Temp Pulse Resp Height Weight  10/24/16 0225 139/75 99.3 F (37.4 C) 63 18 - -  10/24/16 0135 (!) 142/74 98.6 F (37 C) 62 18  (1.6 m) 95.7 kg (211 lb)    Constitutional: Well-developed, well-nourished female in no acute distress.  Cardiovascular: normal rate Respiratory: normal rate and effort.  GI: Abd soft, non-tender, gravid appropriate for gestational age. Pos BS x 4 hypoactive  Incision healing no signs of infection MS: Extremities nontender, no edema, normal ROM Neurologic: Alert and oriented x 4.  GU: Neg CVAT.  LAB RESULTS Results for orders placed or performed during the hospital encounter of 10/24/16 (from the past 24 hour(s))  Urinalysis, Routine w reflex microscopic     Status: Abnormal   Collection Time: 10/24/16  1:45 AM  Result Value Ref Range   Color, Urine AMBER (A) YELLOW   APPearance HAZY (A) CLEAR   Specific Gravity, Urine 1.023 1.005 - 1.030   pH 6.0 5.0 - 8.0   Glucose, UA NEGATIVE NEGATIVE mg/dL   Hgb urine dipstick LARGE (A) NEGATIVE   Bilirubin Urine NEGATIVE NEGATIVE   Ketones, ur 20 (A) NEGATIVE mg/dL   Protein, ur 30 (A) NEGATIVE mg/dL   Nitrite NEGATIVE NEGATIVE   Leukocytes, UA LARGE (A) NEGATIVE   RBC / HPF 6-30 0 - 5 RBC/hpf   WBC, UA TOO NUMEROUS TO COUNT 0 - 5 WBC/hpf   Bacteria, UA MANY (A) NONE SEEN   Squamous Epithelial / LPF 0-5 (A) NONE SEEN   WBC Clumps PRESENT    Mucus PRESENT    CBC WBC 11.3, Hbg 10.3, Hct 30.5, plt 206 Chem wnl     MAU COURSE Orders Placed This Encounter  Procedures  . Urinalysis, Routine w reflex microscopic  . CBC with Differential/Platelet  . Comprehensive metabolic panel   IV hydration with Zofran  MDM PE, labs, IV ASSESSMENT Postpartum 5 days Nausea and vomiting  PLAN Pt was feeling better after IV and Zofran.  Able to hold down crackers and fluids. Discharged home to check Temp BID.  Encouraged ambulation.   Discharge home in stable condition. Will follow up next week in office.  Will send Zofran to pharmacy.      Kenney Houseman, CNM 10/24/2016  3:08 AM

## 2016-10-26 ENCOUNTER — Encounter (HOSPITAL_COMMUNITY): Payer: Self-pay

## 2017-01-17 IMAGING — CR DG CHEST 2V
2 series · 2 of 2 positions shown · non-contrast
Comparison: 02/24/2012

CLINICAL DATA: Chest pain

EXAM:
CHEST  2 VIEW

[w chest pa]
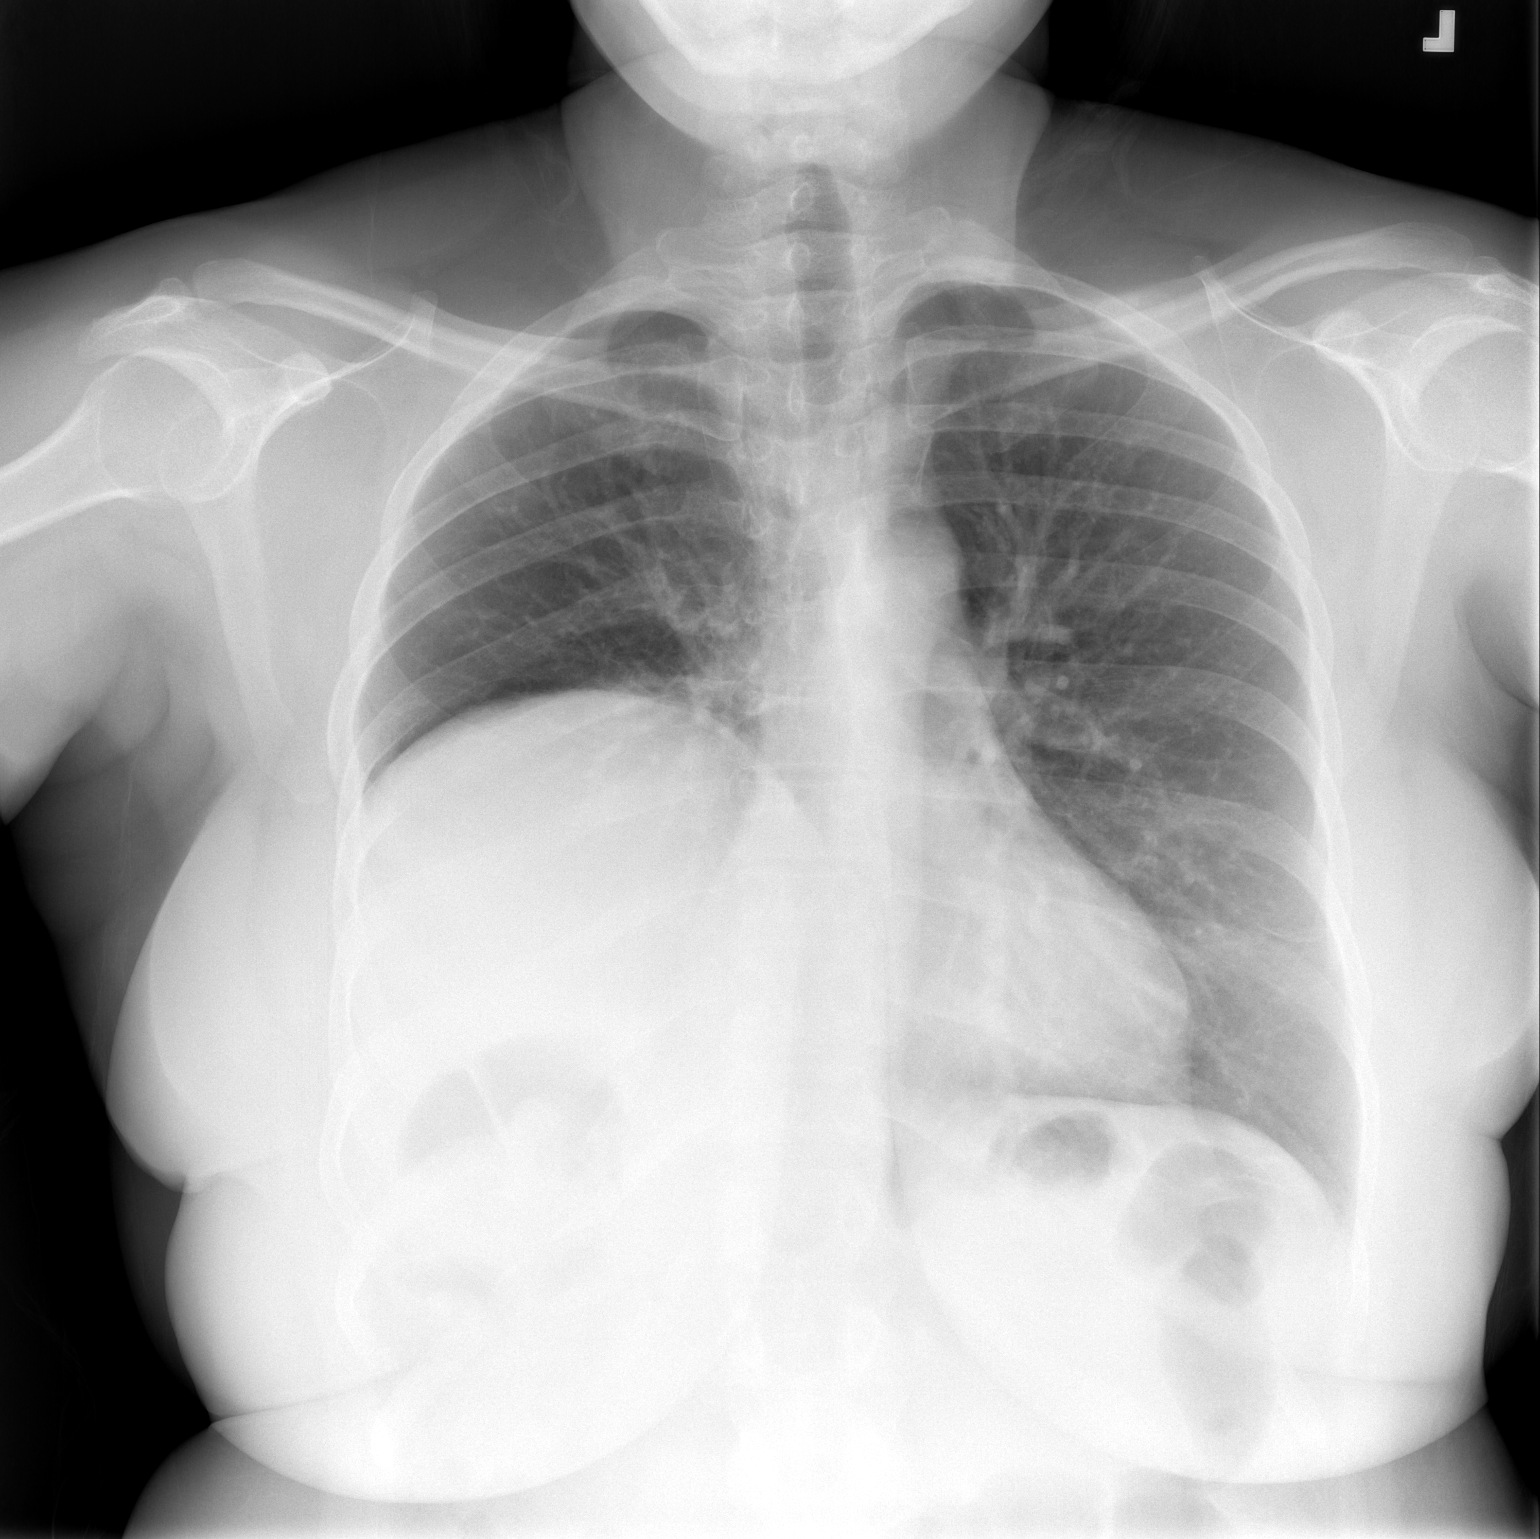

[w chest lat]
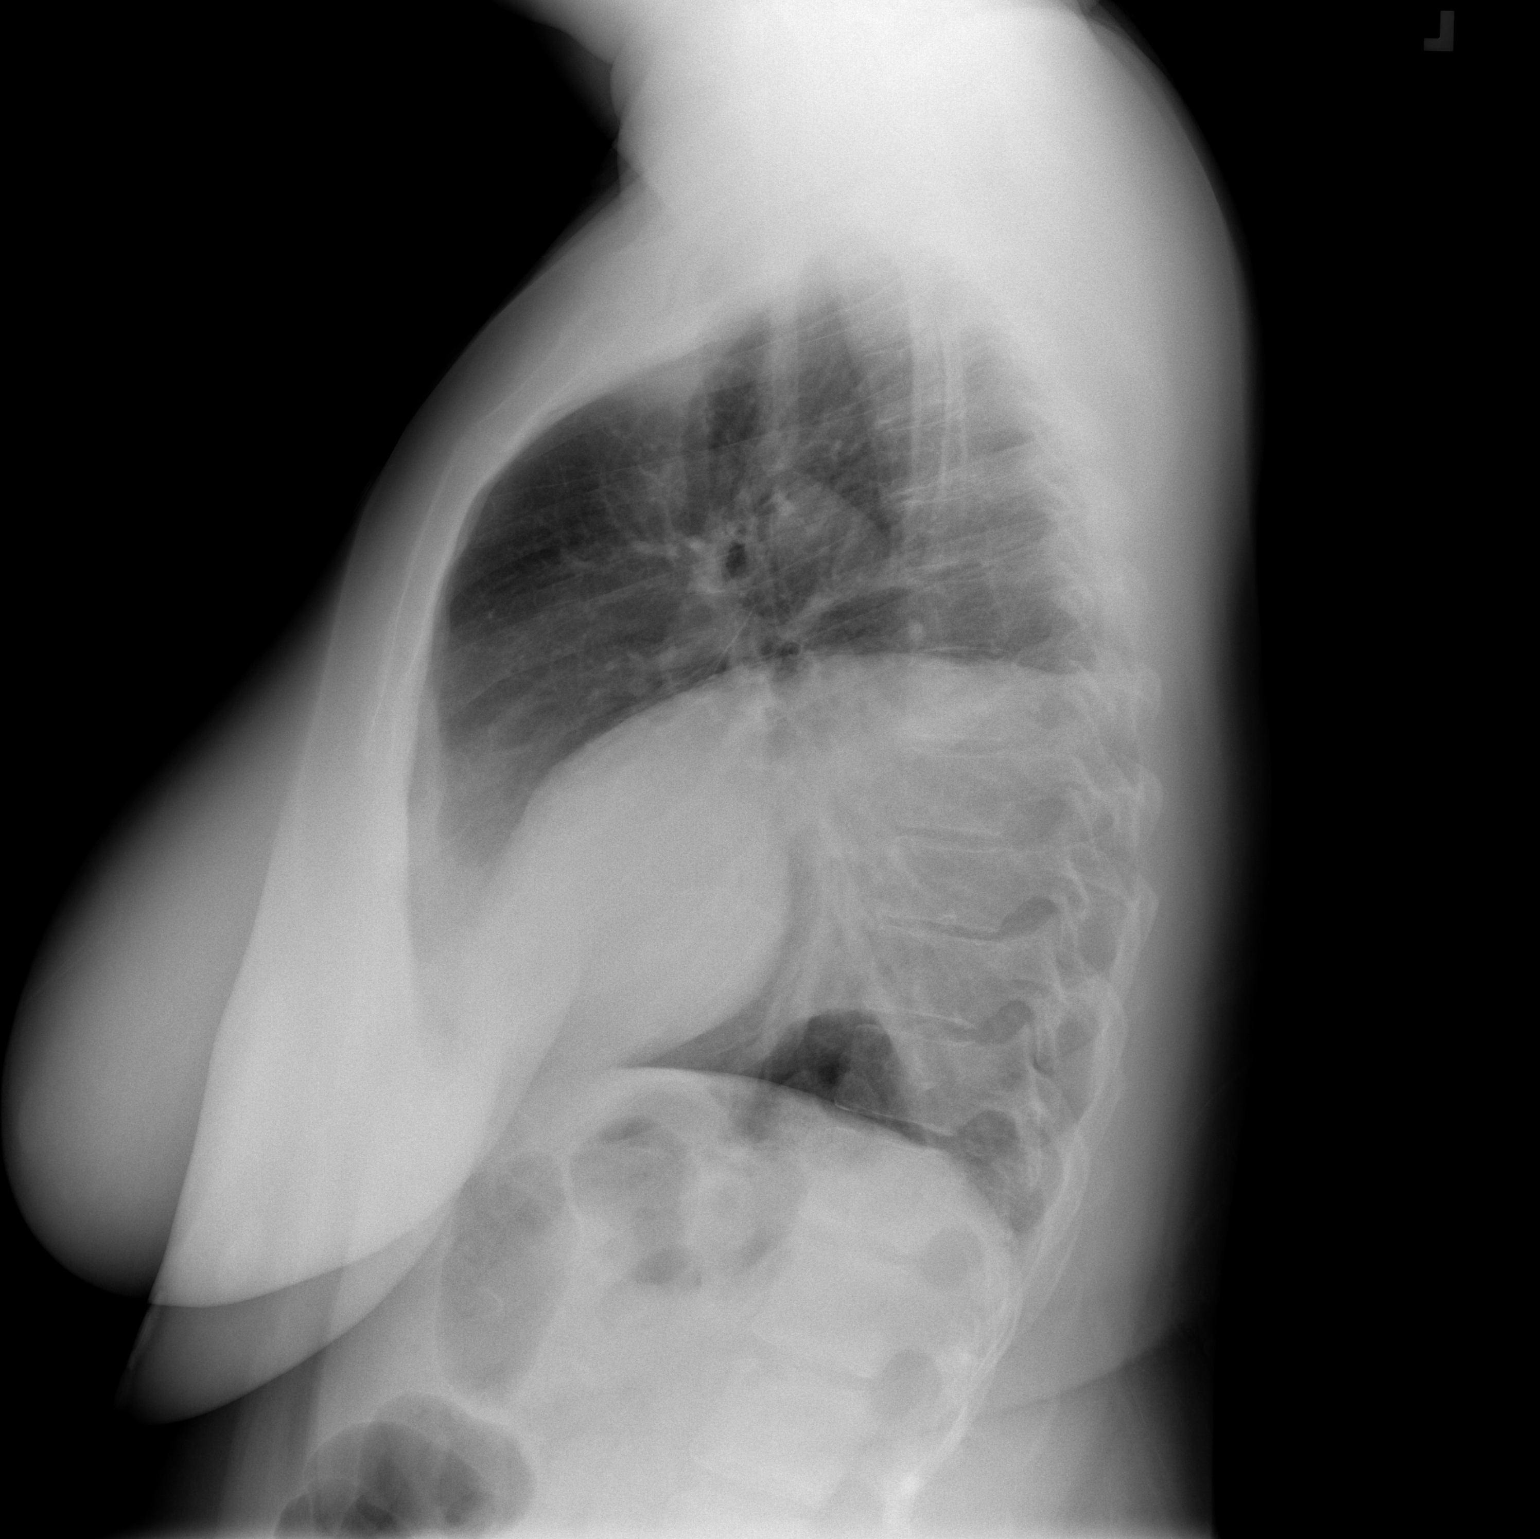

[2 of 2 positions shown; findings below may reference images not displayed]

FINDINGS: Chronic elevation of the right hemidiaphragm is noted. Normal heart
size. No pleural effusion or edema. No airspace consolidation is
identified. The visualized osseous structures are unremarkable.
IMPRESSION: 1. Chronic asymmetric elevation of the right hemidiaphragm.
2. No acute findings.

## 2018-11-27 ENCOUNTER — Other Ambulatory Visit: Payer: Self-pay

## 2018-11-27 DIAGNOSIS — Z20822 Contact with and (suspected) exposure to covid-19: Secondary | ICD-10-CM

## 2018-11-29 LAB — NOVEL CORONAVIRUS, NAA: SARS-CoV-2, NAA: NOT DETECTED

## 2023-05-12 ENCOUNTER — Ambulatory Visit: Admission: EM | Admit: 2023-05-12 | Discharge: 2023-05-12 | Disposition: A | Discharge status: Home / Self Care

## 2023-05-12 ENCOUNTER — Encounter: Payer: Self-pay | Admitting: Emergency Medicine

## 2023-05-12 DIAGNOSIS — Z6837 Body mass index (BMI) 37.0-37.9, adult: Secondary | ICD-10-CM | POA: Insufficient documentation

## 2023-05-12 DIAGNOSIS — N898 Other specified noninflammatory disorders of vagina: Secondary | ICD-10-CM | POA: Insufficient documentation

## 2023-05-12 DIAGNOSIS — A6000 Herpesviral infection of urogenital system, unspecified: Secondary | ICD-10-CM | POA: Insufficient documentation

## 2023-05-12 DIAGNOSIS — B9689 Other specified bacterial agents as the cause of diseases classified elsewhere: Secondary | ICD-10-CM | POA: Insufficient documentation

## 2023-05-12 DIAGNOSIS — N760 Acute vaginitis: Secondary | ICD-10-CM | POA: Insufficient documentation

## 2023-05-12 DIAGNOSIS — E559 Vitamin D deficiency, unspecified: Secondary | ICD-10-CM | POA: Insufficient documentation

## 2023-05-12 DIAGNOSIS — K529 Noninfective gastroenteritis and colitis, unspecified: Secondary | ICD-10-CM

## 2023-05-12 MED ORDER — ONDANSETRON 4 MG PO TBDP: 4.0000 mg | ORAL_TABLET | Freq: Three times a day (TID) | ORAL | 0 refills | Status: AC | PRN

## 2023-05-12 MED ORDER — ONDANSETRON 4 MG PO TBDP
4.0000 mg | ORAL_TABLET | Freq: Once | ORAL | Status: AC
  Administered 2023-05-12: 4 mg via ORAL

## 2023-05-12 NOTE — ED Triage Notes (Signed)
 Pt reports N/V and throbbing headaches that started yesterday. Notes 5-6 emesis episodes in past 24hrs, last emesis: 08:10 this morning. No intake this morning. Notes chills all day yesterday with low grade fever (99.1) in triage. Denies diarrhea. No med use at home. No new foods or meds. Reports daughter had stomach bug last thurs-sat.

## 2023-05-12 NOTE — ED Provider Notes (Signed)
 [facility]    CSN: [id number] Arrival date & time: 05/12/23  7829      History   Chief Complaint Chief Complaint  Patient presents with   Emesis   Headache   Fever    HPI Brenda Arellano is a 48 y.o. female.   Patient here today for evaluation of nausea, vomiting and headaches that started yesterday.  She reports that she has had about 5-6 episodes of vomiting over the last 24 hours.  She reports chills yesterday.  She has not had any diarrhea but has had some abdominal cramping.  She denies abdominal pain otherwise.  She has had some mild congestion and cough as well but states her daughter had given her cold.  Her daughter also recently had a stomach bug about a week ago.  Patient denies any medications for treatment at home.  The history is provided by the patient.  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 no further problems   Pregnancy induced hypertension    Vaginal Pap smear, abnormal     Patient Active Problem List   Diagnosis Date Noted   Genital herpes simplex 05/12/2023   Body mass index (BMI) 37.0-37.9, adult 05/12/2023   Vitamin D deficiency 05/12/2023   Bacterial vaginosis 05/12/2023   Vaginal discharge 05/12/2023   Preeclampsia 10/19/2016   Indication for care or intervention in labor or delivery 10/17/2016   Oligohydramnios in third trimester 10/17/2016   RhD negative 03/21/2016   Supervision of elderly primigravida, third trimester 03/21/2016   Disorder of lung 02/26/2016    Pregnancy 02/26/2016   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 Authorizing Provider  levonorgestrel (MIRENA, 52 MG,) 20 MCG/DAY IUD Take 1 device by intrauterine route. 12/07/16  Yes [provider]  ondansetron (ZOFRAN-ODT) 4 MG disintegrating tablet Take 1 tablet (4 mg total) by mouth every 8 (eight) hours as needed. 05/12/23  Yes Tomi Bamberger, PA-C  cyclobenzaprine (FLEXERIL) 10 MG tablet Take 1 tablet by mouth daily. Patient not taking: Reported on 05/12/2023  [provider]  ibuprofen (ADVIL,MOTRIN) 600 MG tablet Take 1 tablet (600 mg total) by mouth every 6 (six) hours as needed. Patient not taking: Reported on 05/12/2023 10/22/16   Gerrit Heck, CNM  Prenatal Vit-Fe Fumarate-FA (PRENATAL MULTIVITAMIN) TABS tablet Take 1 tablet by mouth daily at 12 noon. Patient not taking: Reported on 05/12/2023    [provider]  valACYclovir (VALTREX) 500 MG tablet Take 500 mg by mouth daily. Patient not taking: Reported on 05/12/2023    [provider]    Family History Family History  Problem Relation Age of Onset   Kidney disease Father    Arthritis Mother    Heart disease Maternal Grandmother    Diabetes Maternal Grandmother    Stroke Maternal Grandmother    Thyroid disease Maternal Grandmother     Social  History Social History   Tobacco Use   Smoking status: Never   Smokeless tobacco: Never  Vaping Use   Vaping status: Never Used  Substance Use Topics   Alcohol use: Yes    Comment: occasionally   Drug use: No     Allergies   Patient has no known allergies.   Review of Systems Review of Systems  Constitutional:  Positive for chills and fever.  HENT:  Positive for congestion. Negative for rhinorrhea.   Eyes:  Negative for discharge and redness.  Respiratory:  Positive for cough. Negative for shortness of breath.   Gastrointestinal:  Positive for nausea and vomiting. Negative for abdominal pain.  Neurological:  Positive for headaches.     Physical Exam Triage Vital Signs ED Triage Vitals  Encounter Vitals Group     BP 05/12/23 0859 (!) 144/90     Systolic BP Percentile --      Diastolic BP Percentile --      Pulse Rate 05/12/23 0859 96     Resp 05/12/23 0859 16     Temp 05/12/23 0859 99.1 F (37.3 C)     Temp Source 05/12/23 0859 Oral     SpO2 05/12/23 0859 96 %     Weight --      Height --      Head Circumference --      Peak Flow --      Pain Score 05/12/23 0900 7     Pain Loc --      Pain Education --      Exclude from Growth Chart --    No data found.  Updated Vital Signs BP (!) 144/90 (BP Location: Left Arm)   Pulse 96   Temp 99.1 F (37.3 C) (Oral)   Resp 16   LMP  (LMP Unknown)   SpO2 96%   Breastfeeding No   Visual Acuity Right Eye Distance:   Left Eye Distance:   Bilateral Distance:    Right Eye Near:   Left Eye Near:    Bilateral Near:     Physical Exam Vitals and nursing note reviewed.  Constitutional:      General: She is not in acute distress.    Appearance: Normal appearance. She is not ill-appearing.  HENT:     Head: Normocephalic and atraumatic.  Eyes:     Conjunctiva/sclera: Conjunctivae normal.  Cardiovascular:     Rate and Rhythm: Normal rate and regular rhythm.  Pulmonary:     Effort: Pulmonary effort is normal. No  respiratory distress.     Breath sounds: No wheezing, rhonchi or rales.  Abdominal:     General: Bowel sounds are  normal. There is no distension.     Palpations: Abdomen is soft.     Tenderness: There is no abdominal tenderness. There is no guarding.  Neurological:     Mental Status: She is alert.  Psychiatric:        Mood and Affect: Mood normal.        Behavior: Behavior normal.        Thought Content: Thought content normal.      UC Treatments / Results  Labs (all labs ordered are listed, but only abnormal results are displayed) Labs Reviewed - No data to display  EKG   Radiology No results found.  Procedures Procedures (including critical care time)  Medications Ordered in UC Medications  ondansetron (ZOFRAN-ODT) disintegrating tablet 4 mg (4 mg Oral Given 05/12/23 0936)    Initial Impression / Assessment and Plan / UC Course  I have reviewed the triage vital signs and the nursing notes.  Pertinent labs & imaging results that were available during my care of the patient were reviewed by me and considered in my medical decision making (see chart for details).    Zofran administered in office with improvement, will prescribe same and recommended increase fluids with electrolyte replacement, and bland diet.  Advised follow-up if no gradual improvement with any further concerns.  Final Clinical Impressions(s) / UC Diagnoses   Final diagnoses:  Gastroenteritis   Discharge Instructions   None    ED Prescriptions     Medication Sig Dispense Auth. Provider   ondansetron (ZOFRAN-ODT) 4 MG disintegrating tablet Take 1 tablet (4 mg total) by mouth every 8 (eight) hours as needed. 20 tablet Tomi Bamberger, PA-C      PDMP not reviewed this encounter.   Tomi Bamberger, PA-C 05/12/23 1040
